# Patient Record
Sex: Female | Born: 1960
Health system: Southern US, Community
[De-identification: ages and names within clinical notes are randomized; demographics above are authoritative.]

## PROBLEM LIST (undated history)

## (undated) DIAGNOSIS — R569 Unspecified convulsions: Secondary | ICD-10-CM

## (undated) DIAGNOSIS — G5602 Carpal tunnel syndrome, left upper limb: Secondary | ICD-10-CM

## (undated) DIAGNOSIS — R51 Headache: Secondary | ICD-10-CM

## (undated) DIAGNOSIS — F32A Depression, unspecified: Secondary | ICD-10-CM

## (undated) DIAGNOSIS — T148XXA Other injury of unspecified body region, initial encounter: Secondary | ICD-10-CM

## (undated) DIAGNOSIS — M722 Plantar fascial fibromatosis: Secondary | ICD-10-CM

## (undated) DIAGNOSIS — Z9889 Other specified postprocedural states: Secondary | ICD-10-CM

## (undated) DIAGNOSIS — K219 Gastro-esophageal reflux disease without esophagitis: Secondary | ICD-10-CM

## (undated) DIAGNOSIS — F419 Anxiety disorder, unspecified: Secondary | ICD-10-CM

## (undated) DIAGNOSIS — F99 Mental disorder, not otherwise specified: Secondary | ICD-10-CM

## (undated) DIAGNOSIS — G4730 Sleep apnea, unspecified: Secondary | ICD-10-CM

## (undated) HISTORY — PX: BUNIONECTOMY: SHX129

## (undated) HISTORY — PX: HAMMER TOE SURGERY: SHX385

## (undated) HISTORY — PX: ELBOW SURGERY: SHX618

## (undated) HISTORY — PX: PLANTAR FASCIA RELEASE: SHX2239

---

## 1998-05-03 ENCOUNTER — Emergency Department (HOSPITAL_COMMUNITY): Admission: EM | Admit: 1998-05-03 | Discharge: 1998-05-03 | Payer: Self-pay | Admitting: Emergency Medicine

## 1998-10-09 ENCOUNTER — Emergency Department (HOSPITAL_COMMUNITY): Admission: EM | Admit: 1998-10-09 | Discharge: 1998-10-09 | Payer: Self-pay | Admitting: Emergency Medicine

## 1998-10-09 ENCOUNTER — Encounter: Payer: Self-pay | Admitting: Emergency Medicine

## 1998-12-27 ENCOUNTER — Emergency Department (HOSPITAL_COMMUNITY): Admission: EM | Admit: 1998-12-27 | Discharge: 1998-12-27 | Payer: Self-pay | Admitting: Emergency Medicine

## 1999-07-11 ENCOUNTER — Emergency Department (HOSPITAL_COMMUNITY): Admission: EM | Admit: 1999-07-11 | Discharge: 1999-07-11 | Payer: Self-pay

## 1999-12-27 ENCOUNTER — Emergency Department (HOSPITAL_COMMUNITY): Admission: EM | Admit: 1999-12-27 | Discharge: 1999-12-27 | Payer: Self-pay | Admitting: *Deleted

## 2004-01-29 ENCOUNTER — Emergency Department (HOSPITAL_COMMUNITY): Admission: EM | Admit: 2004-01-29 | Discharge: 2004-01-29 | Payer: Self-pay | Admitting: Emergency Medicine

## 2004-04-15 ENCOUNTER — Emergency Department (HOSPITAL_COMMUNITY): Admission: EM | Admit: 2004-04-15 | Discharge: 2004-04-15 | Payer: Self-pay | Admitting: Emergency Medicine

## 2005-01-08 ENCOUNTER — Emergency Department (HOSPITAL_COMMUNITY): Admission: EM | Admit: 2005-01-08 | Discharge: 2005-01-08 | Payer: Self-pay | Admitting: Family Medicine

## 2005-05-15 ENCOUNTER — Emergency Department (HOSPITAL_COMMUNITY): Admission: EM | Admit: 2005-05-15 | Discharge: 2005-05-15 | Payer: Self-pay | Admitting: Emergency Medicine

## 2005-08-02 ENCOUNTER — Emergency Department (HOSPITAL_COMMUNITY): Admission: EM | Admit: 2005-08-02 | Discharge: 2005-08-02 | Payer: Self-pay | Admitting: Emergency Medicine

## 2006-02-26 ENCOUNTER — Emergency Department (HOSPITAL_COMMUNITY): Admission: EM | Admit: 2006-02-26 | Discharge: 2006-02-26 | Payer: Self-pay | Admitting: Emergency Medicine

## 2006-07-02 ENCOUNTER — Inpatient Hospital Stay (HOSPITAL_COMMUNITY): Admission: AD | Admit: 2006-07-02 | Discharge: 2006-07-02 | Payer: Self-pay | Admitting: Obstetrics and Gynecology

## 2006-08-29 ENCOUNTER — Emergency Department (HOSPITAL_COMMUNITY): Admission: EM | Admit: 2006-08-29 | Discharge: 2006-08-29 | Payer: Self-pay | Admitting: Family Medicine

## 2006-09-19 ENCOUNTER — Emergency Department (HOSPITAL_COMMUNITY): Admission: EM | Admit: 2006-09-19 | Discharge: 2006-09-19 | Payer: Self-pay | Admitting: Emergency Medicine

## 2006-11-11 ENCOUNTER — Emergency Department (HOSPITAL_COMMUNITY): Admission: EM | Admit: 2006-11-11 | Discharge: 2006-11-11 | Payer: Self-pay | Admitting: Emergency Medicine

## 2006-11-22 ENCOUNTER — Emergency Department (HOSPITAL_COMMUNITY): Admission: EM | Admit: 2006-11-22 | Discharge: 2006-11-22 | Payer: Self-pay | Admitting: Family Medicine

## 2007-01-11 ENCOUNTER — Emergency Department (HOSPITAL_COMMUNITY): Admission: EM | Admit: 2007-01-11 | Discharge: 2007-01-11 | Payer: Self-pay | Admitting: Family Medicine

## 2007-03-11 ENCOUNTER — Ambulatory Visit (HOSPITAL_COMMUNITY): Admission: RE | Admit: 2007-03-11 | Discharge: 2007-03-11 | Payer: Self-pay | Admitting: Family Medicine

## 2007-05-11 ENCOUNTER — Emergency Department (HOSPITAL_COMMUNITY): Admission: EM | Admit: 2007-05-11 | Discharge: 2007-05-12 | Payer: Self-pay | Admitting: Emergency Medicine

## 2007-09-22 ENCOUNTER — Emergency Department (HOSPITAL_COMMUNITY): Admission: EM | Admit: 2007-09-22 | Discharge: 2007-09-22 | Payer: Self-pay | Admitting: Family Medicine

## 2009-02-15 ENCOUNTER — Emergency Department (HOSPITAL_COMMUNITY): Admission: EM | Admit: 2009-02-15 | Discharge: 2009-02-15 | Payer: Self-pay | Admitting: Family Medicine

## 2009-06-07 ENCOUNTER — Emergency Department (HOSPITAL_COMMUNITY): Admission: EM | Admit: 2009-06-07 | Discharge: 2009-06-07 | Payer: Self-pay | Admitting: Family Medicine

## 2010-04-05 ENCOUNTER — Emergency Department (HOSPITAL_COMMUNITY): Admission: EM | Admit: 2010-04-05 | Discharge: 2010-04-05 | Payer: Self-pay | Admitting: Family Medicine

## 2010-04-05 ENCOUNTER — Emergency Department (HOSPITAL_COMMUNITY): Admission: EM | Admit: 2010-04-05 | Discharge: 2010-04-05 | Payer: Self-pay | Admitting: Emergency Medicine

## 2011-01-31 LAB — POCT I-STAT, CHEM 8
BUN: 18 mg/dL (ref 6–23)
Calcium, Ion: 1.18 mmol/L (ref 1.12–1.32)
Chloride: 105 mEq/L (ref 96–112)
Creatinine, Ser: 1 mg/dL (ref 0.4–1.2)
Potassium: 4.1 mEq/L (ref 3.5–5.1)
Sodium: 135 mEq/L (ref 135–145)

## 2011-07-19 ENCOUNTER — Emergency Department (HOSPITAL_COMMUNITY)
Admission: EM | Admit: 2011-07-19 | Discharge: 2011-07-19 | Disposition: A | Discharge status: Home / Self Care | Payer: Medicare Other | Attending: Emergency Medicine | Admitting: Emergency Medicine

## 2011-07-19 ENCOUNTER — Emergency Department (HOSPITAL_COMMUNITY): Payer: Medicare Other

## 2011-07-19 DIAGNOSIS — M542 Cervicalgia: Secondary | ICD-10-CM | POA: Insufficient documentation

## 2011-07-19 DIAGNOSIS — Z79899 Other long term (current) drug therapy: Secondary | ICD-10-CM | POA: Insufficient documentation

## 2011-07-19 DIAGNOSIS — R209 Unspecified disturbances of skin sensation: Secondary | ICD-10-CM | POA: Insufficient documentation

## 2011-07-19 DIAGNOSIS — S139XXA Sprain of joints and ligaments of unspecified parts of neck, initial encounter: Secondary | ICD-10-CM | POA: Insufficient documentation

## 2011-07-19 DIAGNOSIS — G40909 Epilepsy, unspecified, not intractable, without status epilepticus: Secondary | ICD-10-CM | POA: Insufficient documentation

## 2011-07-19 DIAGNOSIS — IMO0002 Reserved for concepts with insufficient information to code with codable children: Secondary | ICD-10-CM | POA: Insufficient documentation

## 2011-07-19 DIAGNOSIS — M25569 Pain in unspecified knee: Secondary | ICD-10-CM | POA: Insufficient documentation

## 2011-08-06 LAB — POCT CARDIAC MARKERS
CKMB, poc: <1 — ABNORMAL LOW
Troponin i, poc: <0.05

## 2011-08-06 LAB — COMPREHENSIVE METABOLIC PANEL
Alkaline Phosphatase: 47
BUN: 9
GFR calc non Af Amer: >60
Sodium: 138
Total Protein: 6.3

## 2011-08-06 LAB — CBC
HCT: 32.9 — ABNORMAL LOW
MCHC: 34.1
RDW: 13
WBC: 6.1

## 2011-08-06 LAB — DIFFERENTIAL
Eosinophils Absolute: 0.1
Lymphocytes Relative: 39
Lymphs Abs: 2.4
Monocytes Relative: 12 — ABNORMAL HIGH
Neutrophils Relative %: 48

## 2011-10-19 ENCOUNTER — Emergency Department (HOSPITAL_COMMUNITY): Payer: Medicare Other

## 2011-10-19 ENCOUNTER — Emergency Department (HOSPITAL_COMMUNITY)
Admission: EM | Admit: 2011-10-19 | Discharge: 2011-10-19 | Disposition: A | Discharge status: Home / Self Care | Payer: Medicare Other | Attending: Emergency Medicine | Admitting: Emergency Medicine

## 2011-10-19 DIAGNOSIS — R0602 Shortness of breath: Secondary | ICD-10-CM | POA: Insufficient documentation

## 2011-10-19 DIAGNOSIS — R059 Cough, unspecified: Secondary | ICD-10-CM | POA: Insufficient documentation

## 2011-10-19 DIAGNOSIS — R6883 Chills (without fever): Secondary | ICD-10-CM | POA: Insufficient documentation

## 2011-10-19 DIAGNOSIS — IMO0001 Reserved for inherently not codable concepts without codable children: Secondary | ICD-10-CM | POA: Insufficient documentation

## 2011-10-19 DIAGNOSIS — R05 Cough: Secondary | ICD-10-CM | POA: Insufficient documentation

## 2011-10-19 DIAGNOSIS — J40 Bronchitis, not specified as acute or chronic: Secondary | ICD-10-CM

## 2011-10-19 DIAGNOSIS — J45909 Unspecified asthma, uncomplicated: Secondary | ICD-10-CM | POA: Insufficient documentation

## 2011-10-19 DIAGNOSIS — R079 Chest pain, unspecified: Secondary | ICD-10-CM | POA: Insufficient documentation

## 2011-10-19 HISTORY — DX: Plantar fascial fibromatosis: M72.2

## 2011-10-19 HISTORY — DX: Other injury of unspecified body region, initial encounter: T14.8XXA

## 2011-10-19 MED ORDER — HYDROCOD POLST-CHLORPHEN POLST 10-8 MG/5ML PO LQCR: 5.0000 mL | Freq: Two times a day (BID) | ORAL | Status: DC | PRN

## 2011-10-19 MED ORDER — ALBUTEROL SULFATE (5 MG/ML) 0.5% IN NEBU
5.0000 mg | INHALATION_SOLUTION | Freq: Once | RESPIRATORY_TRACT | Status: AC
  Administered 2011-10-19: 5 mg via RESPIRATORY_TRACT
  Filled 2011-10-19: qty 1

## 2011-10-19 MED ORDER — PREDNISONE 20 MG PO TABS
60.0000 mg | ORAL_TABLET | Freq: Once | ORAL | Status: AC
  Administered 2011-10-19: 60 mg via ORAL
  Filled 2011-10-19: qty 3

## 2011-10-19 MED ORDER — AZITHROMYCIN 250 MG PO TABS: ORAL_TABLET | ORAL | Status: AC

## 2011-10-19 MED ORDER — IPRATROPIUM BROMIDE 0.02 % IN SOLN
0.5000 mg | Freq: Once | RESPIRATORY_TRACT | Status: AC
  Administered 2011-10-19: 0.5 mg via RESPIRATORY_TRACT
  Filled 2011-10-19: qty 2.5

## 2011-10-19 MED ORDER — HYDROCOD POLST-CHLORPHEN POLST 10-8 MG/5ML PO LQCR
ORAL | Status: AC
  Administered 2011-10-19: 5 mL
  Filled 2011-10-19: qty 5

## 2011-10-19 MED ORDER — ZOLPIDEM TARTRATE 10 MG PO TABS: 10.0000 mg | ORAL_TABLET | Freq: Every evening | ORAL | Status: DC | PRN

## 2011-10-19 NOTE — ED Provider Notes (Signed)
History     CSN: [id number]  Arrival date & time 10/19/11  0820   First MD Initiated Contact with Patient 10/19/11 0831      Chief Complaint  Patient presents with  . Asthma    (Consider location/radiation/quality/duration/timing/severity/associated sxs/prior treatment) Patient is a 50 y.o. female presenting with asthma. The history is provided by the patient.  Asthma This is a new problem. The current episode started in the past 7 days. The problem occurs constantly. The problem has been gradually worsening. Associated symptoms include chest pain, chills, congestion, coughing and myalgias. Pertinent negatives include no fever, rash, sore throat or vomiting. Associated symptoms comments: Symptoms of URI including cough, wheezing, chest tightness similar to other family members in household.. Treatments tried: Using Albuterol inhaler without cough relief. She continues to smoke.    History reviewed. No pertinent past medical history.  No past surgical history on file.  No family history on file.  History  Substance Use Topics  . Smoking status: Not on file  . Smokeless tobacco: Not on file  . Alcohol Use: Not on file    OB History    Grav Para Term Preterm Abortions TAB SAB Ect Mult Living                  Review of Systems  Constitutional: Positive for chills. Negative for fever.  HENT: Positive for congestion. Negative for sore throat and rhinorrhea.   Respiratory: Positive for cough, shortness of breath and wheezing.   Cardiovascular: Positive for chest pain.  Gastrointestinal: Negative.  Negative for vomiting.  Musculoskeletal: Positive for myalgias.  Skin: Negative.  Negative for rash.  Neurological: Negative.     Allergies  Review of patient's allergies indicates no known allergies.  Home Medications   Current Outpatient Rx  Name Route Sig Dispense Refill  . ALBUTEROL SULFATE HFA 108 (90 BASE) MCG/ACT IN AERS Inhalation Inhale 2 puffs into the lungs  every 6 (six) hours as needed. For shortness of breath or wheeze     . DULOXETINE HCL 30 MG PO CPEP Oral Take 30 mg by mouth daily as needed.      Marland Kitchen GABAPENTIN 100 MG PO CAPS Oral Take 100 mg by mouth 3 (three) times daily.      Marland Kitchen HYDROCODONE-ACETAMINOPHEN 5-500 MG PO TABS Oral Take 1 tablet by mouth every 6 (six) hours as needed. For pain       BP 113/81  Pulse 88  Temp(Src) 98 F (36.7 C) (Oral)  Resp 20  SpO2 96%  Physical Exam  Constitutional: She appears well-developed and well-nourished.  HENT:  Head: Normocephalic.  Nose: Mucosal edema present.  Mouth/Throat: Mucous membranes are normal. Posterior oropharyngeal erythema present. No posterior oropharyngeal edema.  Neck: Normal range of motion. Neck supple.  Cardiovascular: Normal rate and regular rhythm.   Pulmonary/Chest: Effort normal.       Prolonged expiration with wheezing throughout cycle. No rales, rhonchi.  Abdominal: Soft. Bowel sounds are normal. There is no tenderness. There is no rebound and no guarding.  Musculoskeletal: Normal range of motion. She exhibits no edema.  Neurological: She is alert. No cranial nerve deficit.  Skin: Skin is warm and dry. No rash noted.  Psychiatric: She has a normal mood and affect.    ED Course  Procedures (including critical care time)  Labs Reviewed - No data to display No results found.   No diagnosis found.    MDM  Patient appears much more comfortable after 2 nebulizers and  Tussionex. Lung sounds improved with less wheezing, but still with diffuse rhonchi.        Rodena Medin, PA 10/19/11 1052

## 2011-10-19 NOTE — ED Notes (Signed)
Pt c/o sob, chest pain d/t coughing, productive cough w/yellow colored sputum x4 days

## 2011-10-19 NOTE — ED Notes (Signed)
Pt c/o sob, low back pain, non cardiac chest pain d/t cough and congestion, and productive cough w/yellow colored sputum x4 days. Reports hx of asthma, denies using at home nebulizer tx, sts "I just didn't feel like it, I felt so bad."

## 2011-10-20 NOTE — ED Provider Notes (Signed)
Medical screening examination/treatment/procedure(s) were performed by non-physician practitioner and as supervising physician I was immediately available for consultation/collaboration.  Glorious Flicker, MD 10/20/11 0933 

## 2012-04-16 ENCOUNTER — Encounter (HOSPITAL_COMMUNITY): Payer: Self-pay | Admitting: Emergency Medicine

## 2012-04-16 ENCOUNTER — Emergency Department (HOSPITAL_COMMUNITY)
Admission: EM | Admit: 2012-04-16 | Discharge: 2012-04-16 | Disposition: A | Discharge status: Home / Self Care | Payer: Medicare Other | Attending: Emergency Medicine | Admitting: Emergency Medicine

## 2012-04-16 DIAGNOSIS — J45909 Unspecified asthma, uncomplicated: Secondary | ICD-10-CM

## 2012-04-16 DIAGNOSIS — F17200 Nicotine dependence, unspecified, uncomplicated: Secondary | ICD-10-CM | POA: Insufficient documentation

## 2012-04-16 DIAGNOSIS — Z720 Tobacco use: Secondary | ICD-10-CM

## 2012-04-16 MED ORDER — ALBUTEROL SULFATE (5 MG/ML) 0.5% IN NEBU
5.0000 mg | INHALATION_SOLUTION | Freq: Once | RESPIRATORY_TRACT | Status: AC
  Administered 2012-04-16: 5 mg via RESPIRATORY_TRACT
  Filled 2012-04-16: qty 0.5

## 2012-04-16 MED ORDER — PREDNISONE 20 MG PO TABS
60.0000 mg | ORAL_TABLET | Freq: Once | ORAL | Status: AC
  Administered 2012-04-16: 60 mg via ORAL
  Filled 2012-04-16: qty 3

## 2012-04-16 MED ORDER — ALBUTEROL SULFATE (5 MG/ML) 0.5% IN NEBU
2.5000 mg | INHALATION_SOLUTION | Freq: Once | RESPIRATORY_TRACT | Status: AC
  Administered 2012-04-16: 2.5 mg via RESPIRATORY_TRACT
  Filled 2012-04-16: qty 0.5

## 2012-04-16 MED ORDER — IPRATROPIUM BROMIDE 0.02 % IN SOLN
RESPIRATORY_TRACT | Status: AC
  Filled 2012-04-16: qty 2.5

## 2012-04-16 MED ORDER — PREDNISONE 20 MG PO TABS: 60.0000 mg | ORAL_TABLET | Freq: Every day | ORAL | Status: AC

## 2012-04-16 MED ORDER — IPRATROPIUM BROMIDE 0.02 % IN SOLN
0.5000 mg | Freq: Once | RESPIRATORY_TRACT | Status: AC
  Administered 2012-04-16: 0.5 mg via RESPIRATORY_TRACT
  Filled 2012-04-16: qty 2.5

## 2012-04-16 MED ORDER — ZOLPIDEM TARTRATE 5 MG PO TABS: 5.0000 mg | ORAL_TABLET | Freq: Every evening | ORAL | Status: DC | PRN

## 2012-04-16 NOTE — Discharge Instructions (Signed)
STOP SMOKING. USE YOUR REGULAR MEDICATIONS AS PRESCRIBED AND TAKE PREDNISONE AS DIRECTED FOR THE NEXT 3 DAYS. RETURN HERE WITH ANY WORSENING SYMPTOMS OR NEW CONCERNS.

## 2012-04-16 NOTE — ED Provider Notes (Signed)
History     CSN: [id number]  Arrival date & time 04/16/12  0541   First MD Initiated Contact with Patient 04/16/12 [phone]      Chief Complaint  Patient presents with  . Shortness of Breath    (Consider location/radiation/quality/duration/timing/severity/associated sxs/prior treatment) Patient is a 51 y.o. female presenting with shortness of breath. The history is provided by the patient.  Shortness of Breath  The current episode started 3 to 5 days ago. The problem has been gradually worsening. The problem is moderate. Associated symptoms include cough, shortness of breath and wheezing. Pertinent negatives include no fever. Associated symptoms comments: Increased wheezing with nasal congestion, chest tightness with cough, decreasing effectiveness of her usual medications. She continues to smoke.. Her past medical history is significant for asthma.    Past Medical History  Diagnosis Date  . Asthma   . Nerve damage   . Plantar fasciitis     Past Surgical History  Procedure Date  . Elbow debridement     Left elbow  . Foot bone excision     bilateral    No family history on file.  History  Substance Use Topics  . Smoking status: Current Everyday Smoker  . Smokeless tobacco: Not on file  . Alcohol Use: Yes    OB History    Grav Para Term Preterm Abortions TAB SAB Ect Mult Living                  Review of Systems  Constitutional: Negative for fever.  HENT: Positive for congestion.   Respiratory: Positive for cough, shortness of breath and wheezing.   Gastrointestinal: Negative for vomiting.    Allergies  Review of patient's allergies indicates no known allergies.  Home Medications   Current Outpatient Rx  Name Route Sig Dispense Refill  . ALBUTEROL SULFATE HFA 108 (90 BASE) MCG/ACT IN AERS Inhalation Inhale 2 puffs into the lungs every 6 (six) hours as needed. For shortness of breath or wheeze     . BUDESONIDE-FORMOTEROL FUMARATE 160-4.5 MCG/ACT IN AERO  Inhalation Inhale 2 puffs into the lungs 2 (two) times daily.    . DULOXETINE HCL 30 MG PO CPEP Oral Take 30 mg by mouth daily as needed.      Marland Kitchen GABAPENTIN 100 MG PO CAPS Oral Take 100 mg by mouth 3 (three) times daily.      Marland Kitchen HYDROCODONE-ACETAMINOPHEN 5-500 MG PO TABS Oral Take 1 tablet by mouth every 6 (six) hours as needed. For pain     . LYRICA PO Oral Take 1 capsule by mouth daily.    . SEROQUEL PO Oral Take 1 tablet by mouth daily.    Marland Kitchen ZOLPIDEM TARTRATE 5 MG PO TABS Oral Take 5 mg by mouth at bedtime as needed. For sleep    . ZOLPIDEM TARTRATE 10 MG PO TABS Oral Take 1 tablet (10 mg total) by mouth at bedtime as needed for sleep. 5 tablet 0    BP 104/73  Pulse 83  Temp 97.9 F (36.6 C) (Oral)  Resp 18  SpO2 96%  Physical Exam  Constitutional: She is oriented to person, place, and time. She appears well-developed and well-nourished. No distress.  HENT:  Mouth/Throat: Oropharynx is clear and moist.  Neck: Normal range of motion.  Cardiovascular: Normal rate and regular rhythm.   No murmur heard. Pulmonary/Chest: Effort normal. No respiratory distress. She has wheezes. She has no rales. She exhibits no tenderness.  Abdominal: There is no tenderness.  Musculoskeletal: She  exhibits no edema.  Neurological: She is alert and oriented to person, place, and time.    ED Course  Procedures (including critical care time)  Labs Reviewed - No data to display No results found.   No diagnosis found. 1. Asthma 2. Tobacco Abuse   MDM  She feels significantly improved after 2 nebulizer treatments and requesting discharge. Wheezing is resolved. Will discharge home.        Rodena Medin, PA-C 04/16/12 [phone]

## 2012-04-16 NOTE — ED Notes (Signed)
Patient with increased shortness of breath increasing in last few days with cough and audible wheezing.

## 2012-04-17 NOTE — ED Provider Notes (Signed)
Medical screening examination/treatment/procedure(s) were performed by non-physician practitioner and as supervising physician I was immediately available for consultation/collaboration.   Darnelle Derrick L Denelle Capurro, MD 04/17/12 [phone] 

## 2012-10-02 ENCOUNTER — Emergency Department (HOSPITAL_COMMUNITY)
Admission: EM | Admit: 2012-10-02 | Discharge: 2012-10-02 | Disposition: A | Discharge status: Home / Self Care | Payer: Medicare Other | Attending: Emergency Medicine | Admitting: Emergency Medicine

## 2012-10-02 ENCOUNTER — Emergency Department (HOSPITAL_COMMUNITY): Payer: Medicare Other

## 2012-10-02 ENCOUNTER — Encounter (HOSPITAL_COMMUNITY): Payer: Self-pay | Admitting: *Deleted

## 2012-10-02 DIAGNOSIS — L299 Pruritus, unspecified: Secondary | ICD-10-CM | POA: Insufficient documentation

## 2012-10-02 DIAGNOSIS — F17200 Nicotine dependence, unspecified, uncomplicated: Secondary | ICD-10-CM | POA: Insufficient documentation

## 2012-10-02 DIAGNOSIS — J3489 Other specified disorders of nose and nasal sinuses: Secondary | ICD-10-CM | POA: Insufficient documentation

## 2012-10-02 DIAGNOSIS — Z8669 Personal history of other diseases of the nervous system and sense organs: Secondary | ICD-10-CM | POA: Insufficient documentation

## 2012-10-02 DIAGNOSIS — J45909 Unspecified asthma, uncomplicated: Secondary | ICD-10-CM | POA: Insufficient documentation

## 2012-10-02 DIAGNOSIS — R21 Rash and other nonspecific skin eruption: Secondary | ICD-10-CM | POA: Insufficient documentation

## 2012-10-02 DIAGNOSIS — J069 Acute upper respiratory infection, unspecified: Secondary | ICD-10-CM | POA: Insufficient documentation

## 2012-10-02 DIAGNOSIS — Z8739 Personal history of other diseases of the musculoskeletal system and connective tissue: Secondary | ICD-10-CM | POA: Insufficient documentation

## 2012-10-02 DIAGNOSIS — Z79899 Other long term (current) drug therapy: Secondary | ICD-10-CM | POA: Insufficient documentation

## 2012-10-02 MED ORDER — HYDROXYZINE HCL 25 MG PO TABS: 25.0000 mg | ORAL_TABLET | Freq: Four times a day (QID) | ORAL | Status: DC

## 2012-10-02 MED ORDER — AMITRIPTYLINE HCL 25 MG PO TABS: 25.0000 mg | ORAL_TABLET | Freq: Every day | ORAL | Status: DC

## 2012-10-02 MED ORDER — METHYLPREDNISOLONE 4 MG PO KIT: PACK | ORAL | Status: DC

## 2012-10-02 MED ORDER — DULOXETINE HCL 30 MG PO CPEP: 30.0000 mg | ORAL_CAPSULE | Freq: Every day | ORAL | Status: DC

## 2012-10-02 MED ORDER — BENZONATATE 100 MG PO CAPS
100.0000 mg | ORAL_CAPSULE | Freq: Three times a day (TID) | ORAL | Status: DC
Start: 2012-10-02 — End: 2012-11-19

## 2012-10-02 NOTE — ED Notes (Signed)
Patient states she uses a CPAP at night  Also noticed "bumps" on her right forearm

## 2012-10-02 NOTE — ED Provider Notes (Signed)
History     CSN: [id number]  Arrival date & time 10/02/12  4098   First MD Initiated Contact with Patient 10/02/12 [phone]      Chief Complaint  Patient presents with  . Cough    (Consider location/radiation/quality/duration/timing/severity/associated sxs/prior treatment) HPI  Alyssa Schwartz is a 51 y.o. female complaining of rhinorrhea for one day. Patient reports a dry cough denies fever, chest pain, shortness of breath, abdominal pain, nausea vomiting, change in bowel or bladder habits. Patient also reports a pruritic rash to right arm onset yesterday. Patient is also requesting refills of amitriptyline, Cymbalta and pain medication. She follows with the [place].  Past Medical History  Diagnosis Date  . Asthma   . Nerve damage   . Plantar fasciitis     Past Surgical History  Procedure Date  . Elbow debridement     Left elbow  . Foot bone excision     bilateral    History reviewed. No pertinent family history.  History  Substance Use Topics  . Smoking status: Current Every Day Smoker  . Smokeless tobacco: Not on file  . Alcohol Use: Yes    OB History    Grav Para Term Preterm Abortions TAB SAB Ect Mult Living                  Review of Systems  Constitutional: Negative for fever.  HENT: Positive for rhinorrhea.   Respiratory: Negative for shortness of breath.   Cardiovascular: Negative for chest pain.  Gastrointestinal: Negative for nausea, vomiting, abdominal pain and diarrhea.  Skin: Positive for rash.  All other systems reviewed and are negative.    Allergies  Review of patient's allergies indicates no known allergies.  Home Medications   Current Outpatient Rx  Name  Route  Sig  Dispense  Refill  . ALBUTEROL SULFATE HFA 108 (90 BASE) MCG/ACT IN AERS   Inhalation   Inhale 2 puffs into the lungs every 6 (six) hours as needed. For shortness of breath or wheeze          . AMITRIPTYLINE HCL PO   Oral   Take 1 tablet by mouth daily.         .  BUDESONIDE-FORMOTEROL FUMARATE 160-4.5 MCG/ACT IN AERO   Inhalation   Inhale 2 puffs into the lungs 2 (two) times daily.         . DULOXETINE HCL 30 MG PO CPEP   Oral   Take 30 mg by mouth daily as needed.           Marland Kitchen GABAPENTIN 100 MG PO CAPS   Oral   Take 100 mg by mouth 3 (three) times daily.           Marland Kitchen HYDROCODONE-ACETAMINOPHEN 5-500 MG PO TABS   Oral   Take 1 tablet by mouth every 6 (six) hours as needed. For pain          . LYRICA PO   Oral   Take 1 capsule by mouth daily.         . QUETIAPINE FUMARATE ER 150 MG PO TB24   Oral   Take 150 mg by mouth at bedtime.         Marland Kitchen ZOLPIDEM TARTRATE 5 MG PO TABS   Oral   Take 1 tablet (5 mg total) by mouth at bedtime as needed. For sleep   10 tablet   0   . ZOLPIDEM TARTRATE 10 MG PO TABS   Oral  Take 1 tablet (10 mg total) by mouth at bedtime as needed for sleep.   5 tablet   0     BP 103/61  Pulse 73  Temp 98.4 F (36.9 C) (Oral)  Resp 24  Ht 5\' 11"  (1.803 m)  Wt 230 lb (104.327 kg)  BMI 32.08 kg/m2  SpO2 99%  Physical Exam  Nursing note and vitals reviewed. Constitutional: She is oriented to person, place, and time. She appears well-developed and well-nourished. No distress.  HENT:  Head: Normocephalic.  Mouth/Throat: Oropharynx is clear and moist.  Eyes: Conjunctivae normal and EOM are normal. Pupils are equal, round, and reactive to light.  Neck: Normal range of motion.  Cardiovascular: Normal rate, regular rhythm and intact distal pulses.   Pulmonary/Chest: Effort normal and breath sounds normal. No stridor. No respiratory distress. She has no wheezes. She has no rales. She exhibits no tenderness.  Abdominal: Soft. Bowel sounds are normal.  Musculoskeletal: Normal range of motion.  Neurological: She is alert and oriented to person, place, and time.  Skin:       Excoriated macules to right arm. Mildly erythematous, no warmth, discharge or tenderness.  Psychiatric: She has a normal mood and  affect.    ED Course  Procedures (including critical care time)  Labs Reviewed - No data to display Dg Chest 2 View  10/02/2012  *RADIOLOGY REPORT*  Clinical Data: Cough and congestion.  CHEST - 2 VIEW  Comparison: 10/19/2011.  Findings: Central pulmonary vascular prominence.  No segmental consolidation or pneumothorax.  Apical pleural thickening greater on the left without associated bony destruction.  Mildly tortuous aorta.  Heart size top normal.  IMPRESSION: No segmental consolidation.  Please see above.   Original Report Authenticated By: Lacy Duverney, M.D.      1. URI (upper respiratory infection)   2. Rash and nonspecific skin eruption       MDM  Vital signs stable, chest x-ray clear, symptoms consistent with URI. Discussed hydration and rest.  We'll treat rash is allergic reaction with Medrol Dosepak and hydroxyzine. Dermatology followup given.   I will refill her amitriptyline and Cymbalta, deferred refill of Vicodin.        Wynetta Emery, PA-C 10/02/12 [phone]

## 2012-10-02 NOTE — ED Notes (Signed)
Patient states her bronchitis is acting up, has a rash on her arms and has chest discomfort from coughing.  Productive cough at times.

## 2012-10-03 NOTE — ED Provider Notes (Signed)
Medical screening examination/treatment/procedure(s) were performed by non-physician practitioner and as supervising physician I was immediately available for consultation/collaboration.    Gerhard Munch, MD 10/03/12 [phone]

## 2012-11-19 ENCOUNTER — Emergency Department (HOSPITAL_COMMUNITY)
Admission: EM | Admit: 2012-11-19 | Discharge: 2012-11-19 | Disposition: A | Discharge status: Home / Self Care | Payer: Medicare Other | Attending: Emergency Medicine | Admitting: Emergency Medicine

## 2012-11-19 ENCOUNTER — Emergency Department (HOSPITAL_COMMUNITY): Payer: Medicare Other

## 2012-11-19 ENCOUNTER — Encounter (HOSPITAL_COMMUNITY): Payer: Self-pay | Admitting: Emergency Medicine

## 2012-11-19 DIAGNOSIS — R05 Cough: Secondary | ICD-10-CM | POA: Insufficient documentation

## 2012-11-19 DIAGNOSIS — J45909 Unspecified asthma, uncomplicated: Secondary | ICD-10-CM | POA: Insufficient documentation

## 2012-11-19 DIAGNOSIS — Z79899 Other long term (current) drug therapy: Secondary | ICD-10-CM | POA: Insufficient documentation

## 2012-11-19 DIAGNOSIS — J189 Pneumonia, unspecified organism: Secondary | ICD-10-CM

## 2012-11-19 DIAGNOSIS — F17200 Nicotine dependence, unspecified, uncomplicated: Secondary | ICD-10-CM | POA: Insufficient documentation

## 2012-11-19 DIAGNOSIS — R059 Cough, unspecified: Secondary | ICD-10-CM | POA: Insufficient documentation

## 2012-11-19 DIAGNOSIS — Z8669 Personal history of other diseases of the nervous system and sense organs: Secondary | ICD-10-CM | POA: Insufficient documentation

## 2012-11-19 DIAGNOSIS — Z8739 Personal history of other diseases of the musculoskeletal system and connective tissue: Secondary | ICD-10-CM | POA: Insufficient documentation

## 2012-11-19 LAB — BASIC METABOLIC PANEL
BUN: 15 mg/dL (ref 6–23)
Chloride: 102 mEq/L (ref 96–112)
Creatinine, Ser: 0.77 mg/dL (ref 0.50–1.10)
GFR calc Af Amer: >90 mL/min (ref >90)

## 2012-11-19 LAB — CBC
HCT: 38.2 % (ref 36.0–46.0)
MCHC: 32.5 g/dL (ref 30.0–36.0)
RDW: 15.6 % — ABNORMAL HIGH (ref 11.5–15.5)

## 2012-11-19 MED ORDER — PREDNISONE 20 MG PO TABS
60.0000 mg | ORAL_TABLET | ORAL | Status: AC
  Administered 2012-11-19: 60 mg via ORAL
  Filled 2012-11-19: qty 3

## 2012-11-19 MED ORDER — PREDNISONE 20 MG PO TABS: 60.0000 mg | ORAL_TABLET | Freq: Every day | ORAL | Status: AC

## 2012-11-19 MED ORDER — ONDANSETRON 4 MG PO TBDP
4.0000 mg | ORAL_TABLET | Freq: Once | ORAL | Status: AC
  Administered 2012-11-19: 4 mg via ORAL

## 2012-11-19 MED ORDER — AZITHROMYCIN 250 MG PO TABS: ORAL_TABLET | ORAL | Status: AC

## 2012-11-19 MED ORDER — AMITRIPTYLINE HCL 25 MG PO TABS: 25.0000 mg | ORAL_TABLET | Freq: Every day | ORAL | Status: DC

## 2012-11-19 MED ORDER — AZITHROMYCIN 250 MG PO TABS
500.0000 mg | ORAL_TABLET | Freq: Once | ORAL | Status: AC
  Administered 2012-11-19: 500 mg via ORAL
  Filled 2012-11-19: qty 2

## 2012-11-19 MED ORDER — DEXTROSE 5 % IV SOLN: 500.0000 mg | Freq: Once | INTRAVENOUS | Status: DC

## 2012-11-19 MED ORDER — ONDANSETRON 4 MG PO TBDP
ORAL_TABLET | ORAL | Status: AC
  Filled 2012-11-19: qty 1

## 2012-11-19 MED ORDER — ALBUTEROL SULFATE (5 MG/ML) 0.5% IN NEBU
2.5000 mg | INHALATION_SOLUTION | RESPIRATORY_TRACT | Status: AC
  Administered 2012-11-19: 2.5 mg via RESPIRATORY_TRACT
  Filled 2012-11-19: qty 20

## 2012-11-19 MED ORDER — IPRATROPIUM BROMIDE 0.02 % IN SOLN
0.5000 mg | RESPIRATORY_TRACT | Status: AC
Start: 2012-11-19 — End: 2012-11-19
  Administered 2012-11-19: 0.5 mg via RESPIRATORY_TRACT
  Filled 2012-11-19: qty 2.5

## 2012-11-19 NOTE — ED Notes (Signed)
EDP in room  

## 2012-11-19 NOTE — ED Notes (Signed)
Pt to XR

## 2012-11-19 NOTE — ED Notes (Signed)
NAD noted at time of d/c home 

## 2012-11-19 NOTE — ED Notes (Signed)
Pt c/o nasal/chest congestion for over 3 weeks. Was seen in ED "a few weeks ago" per pt. PMH asthma, COPD, sleep apnea.

## 2012-11-19 NOTE — ED Provider Notes (Signed)
History     CSN: [id number]  Arrival date & time 11/19/12  4098   First MD Initiated Contact with Patient 11/19/12 [phone]      Chief Complaint  Patient presents with  . Nasal Congestion    (Consider location/radiation/quality/duration/timing/severity/associated sxs/prior treatment) HPI  The patient presents with concern of ongoing cough, congestion, nasal stuffiness.  She states that since an ER visit approximately one month ago she is had persistent symptoms.  She notes minimal relief with short course of steroids, and ongoing albuterol inhaler use.  No new fever, no new confusion, no new disorientation, no new chest pain, no new abdominal pain, no nausea or vomiting. The patient recently had a rash, this has resolved. She states that since that last ER visit she has not seen her primary care physician.  Past Medical History  Diagnosis Date  . Asthma   . Nerve damage   . Plantar fasciitis     Past Surgical History  Procedure Date  . Elbow debridement     Left elbow  . Foot bone excision     bilateral  . Hand surgery     No family history on file.  History  Substance Use Topics  . Smoking status: Current Every Day Smoker  . Smokeless tobacco: Not on file  . Alcohol Use: Yes    OB History    Grav Para Term Preterm Abortions TAB SAB Ect Mult Living                  Review of Systems  Constitutional:       Per HPI, otherwise negative  HENT:       Per HPI, otherwise negative  Eyes: Negative.   Respiratory:       Per HPI, otherwise negative  Cardiovascular:       Per HPI, otherwise negative  Gastrointestinal: Negative for vomiting.  Genitourinary: Negative.   Musculoskeletal:       Per HPI, otherwise negative  Skin: Negative.   Neurological: Negative for syncope.    Allergies  Review of patient's allergies indicates no known allergies.  Home Medications   Current Outpatient Rx  Name  Route  Sig  Dispense  Refill  . ALBUTEROL SULFATE HFA 108 (90  BASE) MCG/ACT IN AERS   Inhalation   Inhale 2 puffs into the lungs every 6 (six) hours as needed. For shortness of breath or wheeze          . AMITRIPTYLINE HCL 25 MG PO TABS   Oral   Take 1 tablet (25 mg total) by mouth at bedtime.   15 tablet   0   . AMITRIPTYLINE HCL PO   Oral   Take 1 tablet by mouth daily.         Marland Kitchen BENZONATATE 100 MG PO CAPS   Oral   Take 1 capsule (100 mg total) by mouth every 8 (eight) hours.   21 capsule   0   . BUDESONIDE-FORMOTEROL FUMARATE 160-4.5 MCG/ACT IN AERO   Inhalation   Inhale 2 puffs into the lungs 2 (two) times daily.         . DULOXETINE HCL 30 MG PO CPEP   Oral   Take 30 mg by mouth daily as needed.           . DULOXETINE HCL 30 MG PO CPEP   Oral   Take 1 capsule (30 mg total) by mouth daily.   15 capsule   0   .  GABAPENTIN 100 MG PO CAPS   Oral   Take 100 mg by mouth 3 (three) times daily.           Marland Kitchen HYDROCODONE-ACETAMINOPHEN 5-500 MG PO TABS   Oral   Take 1 tablet by mouth every 6 (six) hours as needed. For pain          . HYDROXYZINE HCL 25 MG PO TABS   Oral   Take 1 tablet (25 mg total) by mouth every 6 (six) hours.   12 tablet   0   . METHYLPREDNISOLONE 4 MG PO KIT      As directed by package insert   21 tablet   0   . LYRICA PO   Oral   Take 1 capsule by mouth daily.         . QUETIAPINE FUMARATE ER 150 MG PO TB24   Oral   Take 150 mg by mouth at bedtime.         Marland Kitchen ZOLPIDEM TARTRATE 10 MG PO TABS   Oral   Take 1 tablet (10 mg total) by mouth at bedtime as needed for sleep.   5 tablet   0   . ZOLPIDEM TARTRATE 5 MG PO TABS   Oral   Take 1 tablet (5 mg total) by mouth at bedtime as needed. For sleep   10 tablet   0     BP 119/86  Pulse 76  Temp 98.4 F (36.9 C) (Oral)  Resp 20  Ht 5\' 11"  (1.803 m)  Wt 230 lb (104.327 kg)  BMI 32.08 kg/m2  SpO2 97%  Physical Exam  Nursing note and vitals reviewed. Constitutional: She is oriented to person, place, and time. She appears  well-developed and well-nourished. No distress.  HENT:  Head: Normocephalic and atraumatic.  Eyes: Conjunctivae normal and EOM are normal.  Cardiovascular: Normal rate and regular rhythm.   Pulmonary/Chest: Effort normal. No stridor. No respiratory distress. She has wheezes. She has rhonchi.  Abdominal: She exhibits no distension.  Musculoskeletal: She exhibits no edema.  Neurological: She is alert and oriented to person, place, and time. No cranial nerve deficit.  Skin: Skin is warm and dry.  Psychiatric: She has a normal mood and affect.    ED Course  Procedures (including critical care time)   Labs Reviewed  BASIC METABOLIC PANEL  CBC   No results found.   No diagnosis found.  O2- 99%ra, normal  8:48 AM Patient states that she is more comfortable.  9:45 AM The patient is sitting upright, in no distress.  Lung sounds are improved.  MDM  This patient with history of asthma presents with ongoing cough, congestion.  Notably, the patient was seen here approximately one month ago.  She has not seen her physician in the interim.  On exam the patient is in no distress, afebrile.  The patient's lung sounds are poor, but improve substantially with ED interventions.  The patient improved in general, and absent new acute findings, she was appropriate for discharge with primary care followup.  She was encouraged to do this.  She requested a refill of her medication on discharge.  She also requested refill of narcotics, which was deferred.  Gerhard Munch, MD 11/19/12 [phone]

## 2012-12-20 DIAGNOSIS — G5602 Carpal tunnel syndrome, left upper limb: Secondary | ICD-10-CM

## 2012-12-20 HISTORY — DX: Carpal tunnel syndrome, left upper limb: G56.02

## 2012-12-22 ENCOUNTER — Other Ambulatory Visit: Payer: Self-pay | Admitting: Orthopedic Surgery

## 2012-12-23 ENCOUNTER — Encounter (HOSPITAL_BASED_OUTPATIENT_CLINIC_OR_DEPARTMENT_OTHER): Payer: Self-pay | Admitting: *Deleted

## 2012-12-26 ENCOUNTER — Ambulatory Visit (HOSPITAL_BASED_OUTPATIENT_CLINIC_OR_DEPARTMENT_OTHER): Payer: Medicare Other | Admitting: Anesthesiology

## 2012-12-26 ENCOUNTER — Encounter (HOSPITAL_BASED_OUTPATIENT_CLINIC_OR_DEPARTMENT_OTHER): Payer: Self-pay | Admitting: Orthopedic Surgery

## 2012-12-26 ENCOUNTER — Encounter (HOSPITAL_BASED_OUTPATIENT_CLINIC_OR_DEPARTMENT_OTHER)
Admission: RE | Disposition: A | Discharge status: Home / Self Care | Payer: Self-pay | Source: Ambulatory Visit | Attending: Orthopedic Surgery

## 2012-12-26 ENCOUNTER — Ambulatory Visit (HOSPITAL_BASED_OUTPATIENT_CLINIC_OR_DEPARTMENT_OTHER)
Admission: RE | Admit: 2012-12-26 | Discharge: 2012-12-26 | Disposition: A | Discharge status: Home / Self Care | Payer: Medicare Other | Source: Ambulatory Visit | Attending: Orthopedic Surgery | Admitting: Orthopedic Surgery

## 2012-12-26 ENCOUNTER — Encounter (HOSPITAL_BASED_OUTPATIENT_CLINIC_OR_DEPARTMENT_OTHER): Payer: Self-pay | Admitting: Anesthesiology

## 2012-12-26 DIAGNOSIS — J45909 Unspecified asthma, uncomplicated: Secondary | ICD-10-CM | POA: Insufficient documentation

## 2012-12-26 DIAGNOSIS — G4730 Sleep apnea, unspecified: Secondary | ICD-10-CM | POA: Insufficient documentation

## 2012-12-26 DIAGNOSIS — G40909 Epilepsy, unspecified, not intractable, without status epilepticus: Secondary | ICD-10-CM | POA: Insufficient documentation

## 2012-12-26 DIAGNOSIS — G5600 Carpal tunnel syndrome, unspecified upper limb: Secondary | ICD-10-CM | POA: Insufficient documentation

## 2012-12-26 DIAGNOSIS — F17200 Nicotine dependence, unspecified, uncomplicated: Secondary | ICD-10-CM | POA: Insufficient documentation

## 2012-12-26 HISTORY — DX: Mental disorder, not otherwise specified: F99

## 2012-12-26 HISTORY — DX: Unspecified convulsions: R56.9

## 2012-12-26 HISTORY — DX: Headache: R51

## 2012-12-26 HISTORY — PX: CARPAL TUNNEL RELEASE: SHX101

## 2012-12-26 HISTORY — DX: Other specified postprocedural states: Z98.890

## 2012-12-26 HISTORY — DX: Carpal tunnel syndrome, left upper limb: G56.02

## 2012-12-26 HISTORY — DX: Sleep apnea, unspecified: G47.30

## 2012-12-26 HISTORY — DX: Anxiety disorder, unspecified: F41.9

## 2012-12-26 SURGERY — CARPAL TUNNEL RELEASE
Anesthesia: General | Laterality: Left | Wound class: Clean

## 2012-12-26 MED ORDER — ONDANSETRON HCL 4 MG/2ML IJ SOLN
INTRAMUSCULAR | Status: DC | PRN
  Administered 2012-12-26: 4 mg via INTRAVENOUS

## 2012-12-26 MED ORDER — MIDAZOLAM HCL 2 MG/2ML IJ SOLN: 1.0000 mg | INTRAMUSCULAR | Status: DC | PRN

## 2012-12-26 MED ORDER — OXYCODONE HCL 5 MG PO TABS
5.0000 mg | ORAL_TABLET | Freq: Once | ORAL | Status: AC | PRN
Start: 2012-12-26 — End: 2012-12-26
  Administered 2012-12-26: 5 mg via ORAL

## 2012-12-26 MED ORDER — FENTANYL CITRATE 0.05 MG/ML IJ SOLN: 50.0000 ug | INTRAMUSCULAR | Status: DC | PRN

## 2012-12-26 MED ORDER — DEXAMETHASONE SODIUM PHOSPHATE 4 MG/ML IJ SOLN
INTRAMUSCULAR | Status: DC | PRN
  Administered 2012-12-26: 10 mg via INTRAVENOUS

## 2012-12-26 MED ORDER — CEFAZOLIN SODIUM-DEXTROSE 2-3 GM-% IV SOLR
2.0000 g | INTRAVENOUS | Status: AC
  Administered 2012-12-26: 2 g via INTRAVENOUS

## 2012-12-26 MED ORDER — PROPOFOL 10 MG/ML IV BOLUS
INTRAVENOUS | Status: DC | PRN
  Administered 2012-12-26: 200 mg via INTRAVENOUS

## 2012-12-26 MED ORDER — LACTATED RINGERS IV SOLN
INTRAVENOUS | Status: DC
  Administered 2012-12-26 (×2): via INTRAVENOUS

## 2012-12-26 MED ORDER — HYDROMORPHONE HCL PF 1 MG/ML IJ SOLN
0.2500 mg | INTRAMUSCULAR | Status: DC | PRN
  Administered 2012-12-26 (×3): 0.5 mg via INTRAVENOUS

## 2012-12-26 MED ORDER — CHLORHEXIDINE GLUCONATE 4 % EX LIQD: 60.0000 mL | Freq: Once | CUTANEOUS | Status: DC

## 2012-12-26 MED ORDER — MIDAZOLAM HCL 5 MG/5ML IJ SOLN
INTRAMUSCULAR | Status: DC | PRN
  Administered 2012-12-26: 1 mg via INTRAVENOUS

## 2012-12-26 MED ORDER — HYDROCODONE-ACETAMINOPHEN 5-325 MG PO TABS: 1.0000 | ORAL_TABLET | Freq: Four times a day (QID) | ORAL | Status: DC | PRN

## 2012-12-26 MED ORDER — LIDOCAINE HCL (CARDIAC) 20 MG/ML IV SOLN
INTRAVENOUS | Status: DC | PRN
  Administered 2012-12-26: 50 mg via INTRAVENOUS

## 2012-12-26 MED ORDER — 0.9 % SODIUM CHLORIDE (POUR BTL) OPTIME
TOPICAL | Status: DC | PRN
  Administered 2012-12-26: 1000 mL

## 2012-12-26 MED ORDER — CEFAZOLIN SODIUM-DEXTROSE 2-3 GM-% IV SOLR: 2.0000 g | INTRAVENOUS | Status: DC

## 2012-12-26 MED ORDER — MIDAZOLAM HCL 2 MG/ML PO SYRP: 12.0000 mg | ORAL_SOLUTION | Freq: Once | ORAL | Status: DC | PRN

## 2012-12-26 MED ORDER — BUPIVACAINE HCL (PF) 0.25 % IJ SOLN
INTRAMUSCULAR | Status: DC | PRN
  Administered 2012-12-26: 9 mL

## 2012-12-26 MED ORDER — OXYCODONE HCL 5 MG/5ML PO SOLN: 5.0000 mg | Freq: Once | ORAL | Status: AC | PRN

## 2012-12-26 MED ORDER — FENTANYL CITRATE 0.05 MG/ML IJ SOLN
INTRAMUSCULAR | Status: DC | PRN
  Administered 2012-12-26: 100 ug via INTRAVENOUS

## 2012-12-26 SURGICAL SUPPLY — 40 items
BANDAGE GAUZE ELAST BULKY 4 IN (GAUZE/BANDAGES/DRESSINGS) ×2 IMPLANT
BLADE SURG 15 STRL LF DISP TIS (BLADE) ×1 IMPLANT
BLADE SURG 15 STRL SS (BLADE) ×1
BNDG COHESIVE 3X5 TAN STRL LF (GAUZE/BANDAGES/DRESSINGS) ×2 IMPLANT
BNDG ESMARK 4X9 LF (GAUZE/BANDAGES/DRESSINGS) ×2 IMPLANT
CHLORAPREP W/TINT 26ML (MISCELLANEOUS) ×2 IMPLANT
CLOTH BEACON ORANGE TIMEOUT ST (SAFETY) ×2 IMPLANT
CORDS BIPOLAR (ELECTRODE) ×2 IMPLANT
COVER MAYO STAND STRL (DRAPES) ×2 IMPLANT
COVER TABLE BACK 60X90 (DRAPES) ×2 IMPLANT
CUFF TOURNIQUET SINGLE 18IN (TOURNIQUET CUFF) ×2 IMPLANT
DRAPE EXTREMITY T 121X128X90 (DRAPE) ×2 IMPLANT
DRAPE SURG 17X23 STRL (DRAPES) ×2 IMPLANT
DRSG KUZMA FLUFF (GAUZE/BANDAGES/DRESSINGS) ×2 IMPLANT
GAUZE XEROFORM 1X8 LF (GAUZE/BANDAGES/DRESSINGS) ×2 IMPLANT
GLOVE BIO SURGEON STRL SZ 6.5 (GLOVE) IMPLANT
GLOVE BIO SURGEON STRL SZ7.5 (GLOVE) ×2 IMPLANT
GLOVE BIOGEL M STRL SZ7.5 (GLOVE) ×2 IMPLANT
GLOVE BIOGEL PI IND STRL 8 (GLOVE) ×2 IMPLANT
GLOVE BIOGEL PI IND STRL 8.5 (GLOVE) ×1 IMPLANT
GLOVE BIOGEL PI INDICATOR 8 (GLOVE) ×2
GLOVE BIOGEL PI INDICATOR 8.5 (GLOVE) ×1
GLOVE SURG ORTHO 8.0 STRL STRW (GLOVE) ×2 IMPLANT
GOWN BRE IMP PREV XXLGXLNG (GOWN DISPOSABLE) ×6 IMPLANT
GOWN PREVENTION PLUS XLARGE (GOWN DISPOSABLE) IMPLANT
NEEDLE 27GAX1X1/2 (NEEDLE) ×2 IMPLANT
NS IRRIG 1000ML POUR BTL (IV SOLUTION) ×2 IMPLANT
PACK BASIN DAY SURGERY FS (CUSTOM PROCEDURE TRAY) ×2 IMPLANT
PAD CAST 3X4 CTTN HI CHSV (CAST SUPPLIES) ×1 IMPLANT
PADDING CAST ABS 4INX4YD NS (CAST SUPPLIES) ×1
PADDING CAST ABS COTTON 4X4 ST (CAST SUPPLIES) ×1 IMPLANT
PADDING CAST COTTON 3X4 STRL (CAST SUPPLIES) ×1
SPONGE GAUZE 4X4 12PLY (GAUZE/BANDAGES/DRESSINGS) ×2 IMPLANT
STOCKINETTE 4X48 STRL (DRAPES) ×2 IMPLANT
SUT VICRYL 4-0 PS2 18IN ABS (SUTURE) IMPLANT
SUT VICRYL RAPIDE 4/0 PS 2 (SUTURE) ×2 IMPLANT
SYR BULB 3OZ (MISCELLANEOUS) ×2 IMPLANT
SYR CONTROL 10ML LL (SYRINGE) ×2 IMPLANT
TOWEL OR 17X24 6PK STRL BLUE (TOWEL DISPOSABLE) ×2 IMPLANT
UNDERPAD 30X30 INCONTINENT (UNDERPADS AND DIAPERS) ×2 IMPLANT

## 2012-12-26 NOTE — Anesthesia Preprocedure Evaluation (Signed)
Anesthesia Evaluation  Patient identified by MRN, date of birth, ID band Patient awake    Reviewed: Allergy & Precautions, H&P , NPO status , Patient's Chart, lab work & pertinent test results  Airway Mallampati: II TM Distance: >3 FB Neck ROM: Full    Dental no notable dental hx. (+) Teeth Intact, Chipped and Dental Advisory Given   Pulmonary asthma , sleep apnea and Continuous Positive Airway Pressure Ventilation ,  breath sounds clear to auscultation  Pulmonary exam normal       Cardiovascular negative cardio ROS  Rhythm:Regular Rate:Normal     Neuro/Psych  Headaches, Seizures -, Well Controlled,  PSYCHIATRIC DISORDERS  Neuromuscular disease    GI/Hepatic negative GI ROS, Neg liver ROS,   Endo/Other  negative endocrine ROS  Renal/GU negative Renal ROS  negative genitourinary   Musculoskeletal   Abdominal   Peds  Hematology negative hematology ROS (+)   Anesthesia Other Findings   Reproductive/Obstetrics negative OB ROS                           Anesthesia Physical Anesthesia Plan  ASA: III  Anesthesia Plan: General   Post-op Pain Management:    Induction: Intravenous  Airway Management Planned: LMA  Additional Equipment:   Intra-op Plan:   Post-operative Plan: Extubation in OR  Informed Consent: I have reviewed the patients History and Physical, chart, labs and discussed the procedure including the risks, benefits and alternatives for the proposed anesthesia with the patient or authorized representative who has indicated his/her understanding and acceptance.   Dental advisory given  Plan Discussed with: CRNA  Anesthesia Plan Comments:         Anesthesia Quick Evaluation

## 2012-12-26 NOTE — Transfer of Care (Signed)
Immediate Anesthesia Transfer of Care Note  Patient: Alyssa Schwartz  Procedure(s) Performed: Procedure(s): CARPAL TUNNEL RELEASE (Left)  Patient Location: PACU  Anesthesia Type:General  Level of Consciousness: awake and alert   Airway & Oxygen Therapy: Patient Spontanous Breathing and Patient connected to face mask oxygen  Post-op Assessment: Report given to PACU RN and Post -op Vital signs reviewed and stable  Post vital signs: Reviewed and stable  Complications: No apparent anesthesia complications
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
## 2012-12-26 NOTE — Anesthesia Postprocedure Evaluation (Signed)
  Anesthesia Post-op Note  Patient: Alyssa Schwartz  Procedure(s) Performed: Procedure(s): CARPAL TUNNEL RELEASE (Left)  Patient Location: PACU  Anesthesia Type:General  Level of Consciousness: awake, alert  and oriented  Airway and Oxygen Therapy: Patient Spontanous Breathing  Post-op Pain: mild  Post-op Assessment: Post-op Vital signs reviewed, Patient's Cardiovascular Status Stable, Respiratory Function Stable, Patent Airway and No signs of Nausea or vomiting  Post-op Vital Signs: Reviewed and stable  Complications: No apparent anesthesia complications

## 2012-12-26 NOTE — Anesthesia Procedure Notes (Signed)
Procedure Name: LMA Insertion Date/Time: 12/26/2012 2:09 PM Performed by: Zenia Resides D Pre-anesthesia Checklist: Patient identified, Emergency Drugs available, Suction available and Patient being monitored Patient Re-evaluated:Patient Re-evaluated prior to inductionOxygen Delivery Method: Circle System Utilized Preoxygenation: Pre-oxygenation with 100% oxygen Intubation Type: IV induction Ventilation: Mask ventilation without difficulty LMA: LMA inserted LMA Size: 4.0 Number of attempts: 1 Airway Equipment and Method: bite block Placement Confirmation: positive ETCO2 and breath sounds checked- equal and bilateral Tube secured with: Tape Dental Injury: Teeth and Oropharynx as per pre-operative assessment

## 2012-12-26 NOTE — Brief Op Note (Signed)
12/26/2012  2:33 PM  PATIENT:  Tytionna D Basden  52 y.o. female  PRE-OPERATIVE DIAGNOSIS:  LEFT CARPAL TUNNEL SYNDROME  POST-OPERATIVE DIAGNOSIS:  LEFT CARPAL TUNNEL SYNDROME  PROCEDURE:  Procedure(s): CARPAL TUNNEL RELEASE (Left)  SURGEON:  Surgeon(s) and Role:    * Nicki Reaper, MD - Primary    * Tami Ribas, MD - Assisting  PHYSICIAN ASSISTANT:   ASSISTANTS: K Kuzma,MD   ANESTHESIA:   local and general  EBL:  Total I/O In: 1000 [I.V.:1000] Out: -   BLOOD ADMINISTERED:none  DRAINS: none   LOCAL MEDICATIONS USED:  MARCAINE     SPECIMEN:  No Specimen  DISPOSITION OF SPECIMEN:  N/A  COUNTS:  YES  TOURNIQUET:   Total Tourniquet Time Documented: Forearm (Left) - 10 minutes Total: Forearm (Left) - 10 minutes   DICTATION: .Other Dictation: Dictation Number [id number]  PLAN OF CARE: Discharge to home after PACU  PATIENT DISPOSITION:  PACU - hemodynamically stable.

## 2012-12-26 NOTE — Op Note (Signed)
Dictation Number [id number]

## 2012-12-29 ENCOUNTER — Encounter (HOSPITAL_BASED_OUTPATIENT_CLINIC_OR_DEPARTMENT_OTHER): Payer: Self-pay | Admitting: Orthopedic Surgery

## 2012-12-29 NOTE — Op Note (Signed)
Alyssa Schwartz, Alyssa Schwartz NO.:  [id number]  MEDICAL RECORD NO.:  [id number]  LOCATION:                                 FACILITY:  PHYSICIAN:  Cindee Salt, M.D.       DATE OF BIRTH:  [birth year]  DATE OF PROCEDURE:  12/26/2012 DATE OF DISCHARGE:                              OPERATIVE REPORT   PREOPERATIVE DIAGNOSIS:  Carpal tunnel syndrome, left hand.  POSTOPERATIVE DIAGNOSIS:  Carpal tunnel syndrome, left hand.  PROCEDURE:  Decompression of left median nerve.  SURGEON:  Cindee Salt, M.D.  ASSISTANT:  Betha Loa, MD  ANESTHESIA:  General with local infiltration.  ANESTHESIOLOGIST:  Zenon Mayo, MD  HISTORY:  The patient is a 52 year old female with a history of arm pain, nerve conductions reveal carpal tunnel syndrome, left side.  Pre, peri, and postoperative course have been discussed along with risks and complications.  She is aware that there is no guarantee with the surgery; possibility of infection; recurrence of injury to arteries, nerves, tendons, incomplete relief of symptoms, dystrophy.  In the preoperative area, the patient is seen, the extremity was marked by both patient and surgeon.  Antibiotic given.  PROCEDURE IN DETAIL:  The patient was brought to the operating room where a general anesthetic was carried out without difficulty.  She was prepped using ChloraPrep, supine position with the left arm free.  A 3- minute dry time was allowed.  Time-out taken, confirming patient and procedure.  The limb was exsanguinated with an Esmarch bandage. Tourniquet was placed and the arm was inflated to 250 mmHg.  A longitudinal incision was made in the left palm carried down through subcutaneous tissue.  Bleeders electrocauterized with bipolar.  Palmar fascia was split.  Superficial palmar arch identified, the flexor tendon to the ring and little finger identified.  To the ulnar side of the median nerve, the carpal retinaculum was incised with sharp  dissection. Right angle and Sewall retractor were placed between skin and forearm fascia.  The fascia was released for approximately a 1.5 cm proximal to the wrist crease under direct vision.  Canal was explored.  Motor branch was noted to enter into muscle distally.  No further lesions were identified.  The wound was irrigated.  The skin was closed with interrupted 4-0 Vicryl Rapide sutures.  A local infiltration with 0.25% Marcaine without epinephrine was given, 9 mL was used. Sterile compressive dressing with fingers free was applied.  On deflation of the tourniquet, all fingers immediately pinked.  She was taken to the recovery room for observation in satisfactory condition. She will be discharged home to return in [facility] [place] in 1 week on Vicodin.          ______________________________ Cindee Salt, M.D.     GK/MEDQ  D:  12/26/2012  T:  12/27/2012  Job:  [id number]

## 2013-01-09 ENCOUNTER — Other Ambulatory Visit: Payer: Self-pay | Admitting: Orthopedic Surgery

## 2013-01-09 ENCOUNTER — Encounter (HOSPITAL_BASED_OUTPATIENT_CLINIC_OR_DEPARTMENT_OTHER): Payer: Self-pay | Admitting: *Deleted

## 2013-01-12 ENCOUNTER — Encounter (HOSPITAL_BASED_OUTPATIENT_CLINIC_OR_DEPARTMENT_OTHER): Payer: Self-pay | Admitting: *Deleted

## 2013-01-12 ENCOUNTER — Encounter (HOSPITAL_BASED_OUTPATIENT_CLINIC_OR_DEPARTMENT_OTHER): Payer: Self-pay | Admitting: Anesthesiology

## 2013-01-12 ENCOUNTER — Ambulatory Visit (HOSPITAL_BASED_OUTPATIENT_CLINIC_OR_DEPARTMENT_OTHER): Payer: Medicare Other | Admitting: Anesthesiology

## 2013-01-12 ENCOUNTER — Encounter (HOSPITAL_BASED_OUTPATIENT_CLINIC_OR_DEPARTMENT_OTHER)
Admission: RE | Disposition: A | Discharge status: Home / Self Care | Payer: Self-pay | Source: Ambulatory Visit | Attending: Orthopedic Surgery

## 2013-01-12 ENCOUNTER — Ambulatory Visit (HOSPITAL_BASED_OUTPATIENT_CLINIC_OR_DEPARTMENT_OTHER)
Admission: RE | Admit: 2013-01-12 | Discharge: 2013-01-12 | Disposition: A | Discharge status: Home / Self Care | Payer: Medicare Other | Source: Ambulatory Visit | Attending: Orthopedic Surgery | Admitting: Orthopedic Surgery

## 2013-01-12 DIAGNOSIS — F329 Major depressive disorder, single episode, unspecified: Secondary | ICD-10-CM | POA: Insufficient documentation

## 2013-01-12 DIAGNOSIS — G5600 Carpal tunnel syndrome, unspecified upper limb: Secondary | ICD-10-CM | POA: Insufficient documentation

## 2013-01-12 DIAGNOSIS — E669 Obesity, unspecified: Secondary | ICD-10-CM | POA: Insufficient documentation

## 2013-01-12 DIAGNOSIS — F3289 Other specified depressive episodes: Secondary | ICD-10-CM | POA: Insufficient documentation

## 2013-01-12 DIAGNOSIS — F17200 Nicotine dependence, unspecified, uncomplicated: Secondary | ICD-10-CM | POA: Insufficient documentation

## 2013-01-12 DIAGNOSIS — K219 Gastro-esophageal reflux disease without esophagitis: Secondary | ICD-10-CM | POA: Insufficient documentation

## 2013-01-12 DIAGNOSIS — R569 Unspecified convulsions: Secondary | ICD-10-CM | POA: Insufficient documentation

## 2013-01-12 DIAGNOSIS — G4730 Sleep apnea, unspecified: Secondary | ICD-10-CM | POA: Insufficient documentation

## 2013-01-12 DIAGNOSIS — J45909 Unspecified asthma, uncomplicated: Secondary | ICD-10-CM | POA: Insufficient documentation

## 2013-01-12 DIAGNOSIS — Z6834 Body mass index (BMI) 34.0-34.9, adult: Secondary | ICD-10-CM | POA: Insufficient documentation

## 2013-01-12 HISTORY — PX: CARPAL TUNNEL RELEASE: SHX101

## 2013-01-12 LAB — POCT HEMOGLOBIN-HEMACUE: Hemoglobin: 13 g/dL (ref 12.0–15.0)

## 2013-01-12 SURGERY — CARPAL TUNNEL RELEASE
Anesthesia: General | Site: Wrist | Laterality: Right | Wound class: Clean

## 2013-01-12 MED ORDER — 0.9 % SODIUM CHLORIDE (POUR BTL) OPTIME
TOPICAL | Status: DC | PRN
  Administered 2013-01-12: 200 mL

## 2013-01-12 MED ORDER — OXYCODONE HCL 5 MG PO TABS: 5.0000 mg | ORAL_TABLET | Freq: Once | ORAL | Status: DC | PRN

## 2013-01-12 MED ORDER — CHLORHEXIDINE GLUCONATE 4 % EX LIQD: 60.0000 mL | Freq: Once | CUTANEOUS | Status: DC

## 2013-01-12 MED ORDER — CEFAZOLIN SODIUM-DEXTROSE 2-3 GM-% IV SOLR: 2.0000 g | INTRAVENOUS | Status: DC

## 2013-01-12 MED ORDER — FENTANYL CITRATE 0.05 MG/ML IJ SOLN: 50.0000 ug | INTRAMUSCULAR | Status: DC | PRN

## 2013-01-12 MED ORDER — CEFAZOLIN SODIUM-DEXTROSE 2-3 GM-% IV SOLR
2.0000 g | INTRAVENOUS | Status: AC
  Administered 2013-01-12: 2 g via INTRAVENOUS

## 2013-01-12 MED ORDER — MEPERIDINE HCL 25 MG/ML IJ SOLN: 6.2500 mg | INTRAMUSCULAR | Status: DC | PRN

## 2013-01-12 MED ORDER — LIDOCAINE HCL (CARDIAC) 20 MG/ML IV SOLN
INTRAVENOUS | Status: DC | PRN
  Administered 2013-01-12: 80 mg via INTRAVENOUS

## 2013-01-12 MED ORDER — BUPIVACAINE HCL (PF) 0.25 % IJ SOLN
INTRAMUSCULAR | Status: DC | PRN
  Administered 2013-01-12: 7 mL

## 2013-01-12 MED ORDER — MIDAZOLAM HCL 2 MG/2ML IJ SOLN: 1.0000 mg | INTRAMUSCULAR | Status: DC | PRN

## 2013-01-12 MED ORDER — PROMETHAZINE HCL 25 MG/ML IJ SOLN: 6.2500 mg | INTRAMUSCULAR | Status: DC | PRN

## 2013-01-12 MED ORDER — KETOROLAC TROMETHAMINE 30 MG/ML IJ SOLN
INTRAMUSCULAR | Status: DC | PRN
  Administered 2013-01-12: 30 mg via INTRAVENOUS

## 2013-01-12 MED ORDER — HYDROCODONE-ACETAMINOPHEN 5-325 MG PO TABS: 1.0000 | ORAL_TABLET | Freq: Four times a day (QID) | ORAL | Status: DC | PRN

## 2013-01-12 MED ORDER — FENTANYL CITRATE 0.05 MG/ML IJ SOLN
INTRAMUSCULAR | Status: DC | PRN
  Administered 2013-01-12: 100 ug via INTRAVENOUS

## 2013-01-12 MED ORDER — LACTATED RINGERS IV SOLN
INTRAVENOUS | Status: DC
  Administered 2013-01-12: 14:00:00 via INTRAVENOUS

## 2013-01-12 MED ORDER — OXYCODONE HCL 5 MG/5ML PO SOLN: 5.0000 mg | Freq: Once | ORAL | Status: DC | PRN

## 2013-01-12 MED ORDER — ONDANSETRON HCL 4 MG/2ML IJ SOLN
INTRAMUSCULAR | Status: DC | PRN
  Administered 2013-01-12: 4 mg via INTRAVENOUS

## 2013-01-12 MED ORDER — PROPOFOL 10 MG/ML IV BOLUS
INTRAVENOUS | Status: DC | PRN
  Administered 2013-01-12: 200 mg via INTRAVENOUS

## 2013-01-12 MED ORDER — FENTANYL CITRATE 0.05 MG/ML IJ SOLN
25.0000 ug | INTRAMUSCULAR | Status: DC | PRN
  Administered 2013-01-12: 25 ug via INTRAVENOUS
  Administered 2013-01-12: 50 ug via INTRAVENOUS

## 2013-01-12 MED ORDER — DEXAMETHASONE SODIUM PHOSPHATE 4 MG/ML IJ SOLN
INTRAMUSCULAR | Status: DC | PRN
Start: 2013-01-12 — End: 2013-01-12
  Administered 2013-01-12: 10 mg via INTRAVENOUS

## 2013-01-12 MED ORDER — MIDAZOLAM HCL 2 MG/2ML IJ SOLN: 0.5000 mg | Freq: Once | INTRAMUSCULAR | Status: DC | PRN

## 2013-01-12 SURGICAL SUPPLY — 40 items
BANDAGE GAUZE ELAST BULKY 4 IN (GAUZE/BANDAGES/DRESSINGS) ×2 IMPLANT
BLADE SURG 15 STRL LF DISP TIS (BLADE) ×1 IMPLANT
BLADE SURG 15 STRL SS (BLADE) ×1
BNDG COHESIVE 3X5 TAN STRL LF (GAUZE/BANDAGES/DRESSINGS) ×2 IMPLANT
BNDG ESMARK 4X9 LF (GAUZE/BANDAGES/DRESSINGS) ×2 IMPLANT
CHLORAPREP W/TINT 26ML (MISCELLANEOUS) ×2 IMPLANT
CLOTH BEACON ORANGE TIMEOUT ST (SAFETY) ×2 IMPLANT
CORDS BIPOLAR (ELECTRODE) ×2 IMPLANT
COVER MAYO STAND STRL (DRAPES) ×2 IMPLANT
COVER TABLE BACK 60X90 (DRAPES) ×2 IMPLANT
CUFF TOURNIQUET SINGLE 18IN (TOURNIQUET CUFF) ×2 IMPLANT
DRAPE EXTREMITY T 121X128X90 (DRAPE) ×2 IMPLANT
DRAPE SURG 17X23 STRL (DRAPES) ×2 IMPLANT
DRSG KUZMA FLUFF (GAUZE/BANDAGES/DRESSINGS) ×2 IMPLANT
GAUZE XEROFORM 1X8 LF (GAUZE/BANDAGES/DRESSINGS) ×2 IMPLANT
GLOVE BIO SURGEON STRL SZ 6.5 (GLOVE) ×2 IMPLANT
GLOVE BIO SURGEON STRL SZ7 (GLOVE) ×2 IMPLANT
GLOVE BIOGEL PI IND STRL 7.0 (GLOVE) ×1 IMPLANT
GLOVE BIOGEL PI IND STRL 8.5 (GLOVE) ×1 IMPLANT
GLOVE BIOGEL PI INDICATOR 7.0 (GLOVE) ×1
GLOVE BIOGEL PI INDICATOR 8.5 (GLOVE) ×1
GLOVE EXAM NITRILE LRG STRL (GLOVE) ×2 IMPLANT
GLOVE SURG ORTHO 8.0 STRL STRW (GLOVE) ×2 IMPLANT
GOWN BRE IMP PREV XXLGXLNG (GOWN DISPOSABLE) ×2 IMPLANT
GOWN PREVENTION PLUS XLARGE (GOWN DISPOSABLE) ×4 IMPLANT
NEEDLE 27GAX1X1/2 (NEEDLE) ×2 IMPLANT
NS IRRIG 1000ML POUR BTL (IV SOLUTION) ×2 IMPLANT
PACK BASIN DAY SURGERY FS (CUSTOM PROCEDURE TRAY) ×2 IMPLANT
PAD CAST 3X4 CTTN HI CHSV (CAST SUPPLIES) ×1 IMPLANT
PADDING CAST ABS 4INX4YD NS (CAST SUPPLIES) ×1
PADDING CAST ABS COTTON 4X4 ST (CAST SUPPLIES) ×1 IMPLANT
PADDING CAST COTTON 3X4 STRL (CAST SUPPLIES) ×1
SPONGE GAUZE 4X4 12PLY (GAUZE/BANDAGES/DRESSINGS) ×2 IMPLANT
STOCKINETTE 4X48 STRL (DRAPES) ×2 IMPLANT
SUT VICRYL 4-0 PS2 18IN ABS (SUTURE) IMPLANT
SUT VICRYL RAPIDE 4/0 PS 2 (SUTURE) ×2 IMPLANT
SYR BULB 3OZ (MISCELLANEOUS) ×2 IMPLANT
SYR CONTROL 10ML LL (SYRINGE) ×2 IMPLANT
TOWEL OR 17X24 6PK STRL BLUE (TOWEL DISPOSABLE) ×2 IMPLANT
UNDERPAD 30X30 INCONTINENT (UNDERPADS AND DIAPERS) ×2 IMPLANT

## 2013-01-12 NOTE — Op Note (Signed)
NAMEARRION, BURRUEL NO.:  [id number]  MEDICAL RECORD NO.:  [id number]  LOCATION:                                 FACILITY:  PHYSICIAN:  Cindee Salt, M.D.       DATE OF BIRTH:  1960/12/12  DATE OF PROCEDURE:  01/12/2013 DATE OF DISCHARGE:                              OPERATIVE REPORT   PREOPERATIVE DIAGNOSIS:  Carpal tunnel syndrome, right hand.  POSTOPERATIVE DIAGNOSIS:  Carpal tunnel syndrome, right hand.  OPERATION:  Decompression right median nerve.  SURGEON:  Cindee Salt, MD  ANESTHESIA:  General with local infiltration.  ANESTHESIOLOGIST:  Dr. Jean Rosenthal.  HISTORY:  The patient is a 52 year old female with a history of bilateral carpal tunnel syndrome.  She has undergone release on her left side, is admitted now for her right.  Pre, peri, and postoperative course have been discussed along with risks and complications.  She is aware that there is no guarantee with the surgery; possibility of infection, recurrence injury to arteries, nerves, tendons, incomplete relief of symptoms and dystrophy.  In the preoperative area, the patient is seen, the extremity marked by both the patient and surgeon. Antibiotic given.  PROCEDURE:  The patient was brought to the operating room where a general anesthetic was carried out without difficulty.  She was prepped using ChloraPrep, supine position with the right arm free.  A 3-minute dry time was allowed.  Time-out taken, confirming patient and procedure. The limb was exsanguinated with an Esmarch bandage.  Tourniquet placed high and the arm was inflated to 250 mmHg.  A longitudinal incision was made in the right palm and carried down through subcutaneous tissue. Bleeders were electrocauterized with bipolar.  The palmar fascia was split.  Superficial palmar arch identified.  The flexor tendon to the ring and little finger identified.  To the ulnar side of the median nerve, the carpal retinaculum was incised with sharp  dissection.  Right angle and Sewall retractor were placed between skin and forearm fascia. The fascia was released for approximately a centimeter and half proximal to the wrist crease under direct vision.  Canal was explored.  Area of compression to the nerve was apparent.  Motor branch entered into muscle.  No further lesions were identified.  The wound was copiously irrigated with saline and the skin closed with interrupted 4-0 Vicryl Rapide sutures.  Local infiltration with 0.25% Marcaine without epinephrine was given, 7 mL was used.  A sterile compressive dressing was applied with the fingers free.  On deflation of the tourniquet, all fingers immediately pinked.  She was taken to the recovery room for observation in satisfactory condition.  She will be discharged home to return to the [facility] in 1 week on Norco.          ______________________________ Cindee Salt, M.D.     GK/MEDQ  D:  01/12/2013  T:  01/12/2013  Job:  [id number]

## 2013-01-12 NOTE — Transfer of Care (Signed)
Immediate Anesthesia Transfer of Care Note  Patient: Alyssa Schwartz  Procedure(s) Performed: Procedure(s): Right Carpal Tunnel Release (Right)  Patient Location: PACU  Anesthesia Type:General  Level of Consciousness: awake and alert   Airway & Oxygen Therapy: Patient Spontanous Breathing and Patient connected to face mask oxygen  Post-op Assessment: Report given to PACU RN and Post -op Vital signs reviewed and stable  Post vital signs: Reviewed and stable  Complications: No apparent anesthesia complications

## 2013-01-12 NOTE — Interval H&P Note (Signed)
History and Physical Interval Note: Heart:reg Chest: clear Abd: soft  01/12/2013 2:11 PM  Alyssa Schwartz  has presented today for surgery, with the diagnosis of RIGHT CARPAL TUNNEL SYNDROME   The various methods of treatment have been discussed with the patient and family. After consideration of risks, benefits and other options for treatment, the patient has consented to  Procedure(s): RIGHT CARPAL TUNNEL RELEASE (Right) as a surgical intervention .  The patient's history has been reviewed, patient examined, no change in status, stable for surgery.  I have reviewed the patient's chart and labs.  Questions were answered to the patient's satisfaction.     [name] R

## 2013-01-12 NOTE — Anesthesia Postprocedure Evaluation (Signed)
  Anesthesia Post-op Note  Patient: Alyssa Schwartz  Procedure(s) Performed: Procedure(s): Right Carpal Tunnel Release (Right)  Patient Location: PACU  Anesthesia Type:General  Level of Consciousness: awake, alert , oriented and patient cooperative  Airway and Oxygen Therapy: Patient Spontanous Breathing  Post-op Pain: mild  Post-op Assessment: Post-op Vital signs reviewed, Patient's Cardiovascular Status Stable, Respiratory Function Stable, Patent Airway, No signs of Nausea or vomiting and Pain level controlled  Post-op Vital Signs: Reviewed and stable  Complications: No apparent anesthesia complications
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
## 2013-01-12 NOTE — Op Note (Signed)
Other Dictation: Dictation Number [id number]

## 2013-01-12 NOTE — Brief Op Note (Signed)
01/12/2013  4:57 PM  PATIENT:  Alyssa Schwartz  52 y.o. female  PRE-OPERATIVE DIAGNOSIS:  Right Carpal Tunnel Syndrome   POST-OPERATIVE DIAGNOSIS:  Right Carpal Tunnel Syndrome  PROCEDURE:  Procedure(s): Right Carpal Tunnel Release (Right)  SURGEON:  Surgeon(s) and Role:    * Nicki Reaper, MD - Primary  PHYSICIAN ASSISTANT:   ASSISTANTS: none   ANESTHESIA:   local and general  EBL:  Total I/O In: 1000 [I.V.:1000] Out: -   BLOOD ADMINISTERED:none  DRAINS: none   LOCAL MEDICATIONS USED:  MARCAINE     SPECIMEN:  No Specimen  DISPOSITION OF SPECIMEN:  N/A  COUNTS:  YES  TOURNIQUET:   Total Tourniquet Time Documented: Upper Arm (Right) - 12 minutes Total: Upper Arm (Right) - 12 minutes   DICTATION: .Other Dictation: Dictation Number [id number]  PLAN OF CARE: Discharge to home after PACU  PATIENT DISPOSITION:  PACU - hemodynamically stable.

## 2013-01-12 NOTE — Anesthesia Preprocedure Evaluation (Signed)
Anesthesia Evaluation  Patient identified by MRN, date of birth, ID band Patient awake    Reviewed: Allergy & Precautions, H&P , NPO status , Patient's Chart, lab work & pertinent test results  History of Anesthesia Complications Negative for: history of anesthetic complications  Airway Mallampati: II TM Distance: >3 FB Neck ROM: Full    Dental  (+) Chipped, Missing and Dental Advisory Given   Pulmonary asthma , sleep apnea and Continuous Positive Airway Pressure Ventilation , Current Smoker,  breath sounds clear to auscultation  Pulmonary exam normal       Cardiovascular negative cardio ROS  Rhythm:Regular Rate:Normal     Neuro/Psych  Headaches, Seizures -, Well Controlled and Poorly Controlled,  Anxiety    GI/Hepatic Neg liver ROS, GERD-  Medicated and Controlled,  Endo/Other  negative endocrine ROS  Renal/GU negative Renal ROS     Musculoskeletal   Abdominal (+) + obese,   Peds  Hematology   Anesthesia Other Findings   Reproductive/Obstetrics                           Anesthesia Physical Anesthesia Plan  ASA: III  Anesthesia Plan: General   Post-op Pain Management:    Induction: Intravenous  Airway Management Planned: LMA  Additional Equipment:   Intra-op Plan:   Post-operative Plan:   Informed Consent: I have reviewed the patients History and Physical, chart, labs and discussed the procedure including the risks, benefits and alternatives for the proposed anesthesia with the patient or authorized representative who has indicated his/her understanding and acceptance.   Dental advisory given  Plan Discussed with: CRNA and Surgeon  Anesthesia Plan Comments: (Plan routine monitors, GA- LMA OK)        Anesthesia Quick Evaluation

## 2013-01-12 NOTE — Anesthesia Procedure Notes (Signed)
Procedure Name: LMA Insertion Performed by: Meng Winterton W Pre-anesthesia Checklist: Patient identified, Timeout performed, Emergency Drugs available, Suction available and Patient being monitored Patient Re-evaluated:Patient Re-evaluated prior to inductionOxygen Delivery Method: Circle system utilized Preoxygenation: Pre-oxygenation with 100% oxygen Intubation Type: IV induction Ventilation: Mask ventilation without difficulty LMA: LMA inserted LMA Size: 5.0 Number of attempts: 1 Placement Confirmation: breath sounds checked- equal and bilateral and positive ETCO2 Tube secured with: Tape Dental Injury: Teeth and Oropharynx as per pre-operative assessment      

## 2013-01-13 ENCOUNTER — Encounter (HOSPITAL_BASED_OUTPATIENT_CLINIC_OR_DEPARTMENT_OTHER): Payer: Self-pay | Admitting: Orthopedic Surgery

## 2013-02-16 ENCOUNTER — Encounter (HOSPITAL_COMMUNITY): Payer: Self-pay | Admitting: Emergency Medicine

## 2013-02-16 ENCOUNTER — Emergency Department (HOSPITAL_COMMUNITY)
Admission: EM | Admit: 2013-02-16 | Discharge: 2013-02-16 | Disposition: A | Discharge status: Home / Self Care | Payer: Medicare Other | Attending: Emergency Medicine | Admitting: Emergency Medicine

## 2013-02-16 ENCOUNTER — Emergency Department (HOSPITAL_COMMUNITY): Payer: Medicare Other

## 2013-02-16 ENCOUNTER — Telehealth (HOSPITAL_COMMUNITY): Payer: Self-pay | Admitting: Emergency Medicine

## 2013-02-16 DIAGNOSIS — F411 Generalized anxiety disorder: Secondary | ICD-10-CM | POA: Insufficient documentation

## 2013-02-16 DIAGNOSIS — G40909 Epilepsy, unspecified, not intractable, without status epilepticus: Secondary | ICD-10-CM | POA: Insufficient documentation

## 2013-02-16 DIAGNOSIS — R21 Rash and other nonspecific skin eruption: Secondary | ICD-10-CM

## 2013-02-16 DIAGNOSIS — Z79899 Other long term (current) drug therapy: Secondary | ICD-10-CM | POA: Insufficient documentation

## 2013-02-16 DIAGNOSIS — G43909 Migraine, unspecified, not intractable, without status migrainosus: Secondary | ICD-10-CM | POA: Insufficient documentation

## 2013-02-16 DIAGNOSIS — F17200 Nicotine dependence, unspecified, uncomplicated: Secondary | ICD-10-CM | POA: Insufficient documentation

## 2013-02-16 DIAGNOSIS — R059 Cough, unspecified: Secondary | ICD-10-CM | POA: Insufficient documentation

## 2013-02-16 DIAGNOSIS — R05 Cough: Secondary | ICD-10-CM

## 2013-02-16 DIAGNOSIS — Z760 Encounter for issue of repeat prescription: Secondary | ICD-10-CM | POA: Insufficient documentation

## 2013-02-16 DIAGNOSIS — M549 Dorsalgia, unspecified: Secondary | ICD-10-CM | POA: Insufficient documentation

## 2013-02-16 DIAGNOSIS — G4730 Sleep apnea, unspecified: Secondary | ICD-10-CM | POA: Insufficient documentation

## 2013-02-16 DIAGNOSIS — Z872 Personal history of diseases of the skin and subcutaneous tissue: Secondary | ICD-10-CM | POA: Insufficient documentation

## 2013-02-16 DIAGNOSIS — Z8669 Personal history of other diseases of the nervous system and sense organs: Secondary | ICD-10-CM | POA: Insufficient documentation

## 2013-02-16 DIAGNOSIS — J45909 Unspecified asthma, uncomplicated: Secondary | ICD-10-CM | POA: Insufficient documentation

## 2013-02-16 DIAGNOSIS — Z8659 Personal history of other mental and behavioral disorders: Secondary | ICD-10-CM | POA: Insufficient documentation

## 2013-02-16 MED ORDER — PERMETHRIN 5 % EX CREA: TOPICAL_CREAM | CUTANEOUS | Status: DC

## 2013-02-16 MED ORDER — SERTRALINE HCL 50 MG PO TABS: 100.0000 mg | ORAL_TABLET | Freq: Three times a day (TID) | ORAL | Status: DC

## 2013-02-16 MED ORDER — CYCLOBENZAPRINE HCL 10 MG PO TABS: 10.0000 mg | ORAL_TABLET | Freq: Two times a day (BID) | ORAL | Status: DC | PRN

## 2013-02-16 NOTE — ED Notes (Signed)
Pt d.c home in NAD. Signature pad unavailable, pt verbalized understanding of d/c instructions and follow up care.Marland Kitchen

## 2013-02-16 NOTE — ED Notes (Signed)
Pt c/o back pain worse with cough and movement x 5 days; pt sts productive cough x 5 days also

## 2013-02-16 NOTE — ED Provider Notes (Signed)
History     CSN: [id number]  Arrival date & time 02/16/13  9562   First MD Initiated Contact with Patient 02/16/13 1209      Chief Complaint  Patient presents with  . Back Pain  . Cough    (Consider location/radiation/quality/duration/timing/severity/associated sxs/prior treatment) HPI  Patient is a 52 year old female presenting with multiple complaints. Patient states the complaint that brought her in with some right-sided throbbing constant 7/10 back pain that began 5 days ago at the worse with movement. Patient denies any weakness, numbness or tingling down the extremities, decreased strength. Patient also with complaint of pruritic non draining rash on left hand and on lower back that is worse at night. Denies exposure to bed bugs or scabies. Denies fevers, chills, nausea, vomiting, abdominal pain, diarrhea, shortness of breath, chest pain.  Past Medical History  Diagnosis Date  . Nerve damage     hands  . Plantar fasciitis     bilateral  . Seizures     seizure disorder; last seizure 1 yr. ago  . Headache     migraines  . Anxiety   . Mental disorder     anger issues  . Asthma     daily inhalers  . History of foot surgery     2 surgeries on left foot, 3 on right foot  . Carpal tunnel syndrome of left wrist 12/2012  . Sleep apnea     uses CPAP nightly    Past Surgical History  Procedure Laterality Date  . Elbow surgery Left   . Plantar fascia release    . Bunionectomy    . Hammer toe surgery    . Carpal tunnel release Left 12/26/2012    Procedure: CARPAL TUNNEL RELEASE;  Surgeon: Nicki Reaper, MD;  Location: [facility];  Service: Orthopedics;  Laterality: Left;  . Carpal tunnel release Right 01/12/2013    Procedure: Right Carpal Tunnel Release;  Surgeon: Nicki Reaper, MD;  Location: [facility];  Service: Orthopedics;  Laterality: Right;    History reviewed. No pertinent family history.  History  Substance Use Topics  . Smoking  status: Current Every Day Smoker -- 0.50 packs/day for 30 years    Types: Cigarettes  . Smokeless tobacco: Never Used     Comment: 9 cig./day  . Alcohol Use: Yes     Comment: socially    OB History   Grav Para Term Preterm Abortions TAB SAB Ect Mult Living                  Review of Systems  Constitutional: Negative for fever and chills.  HENT: Negative.   Cardiovascular: Negative.   Gastrointestinal: Negative.   Genitourinary: Negative.   Musculoskeletal: Positive for back pain.  Skin: Positive for rash.  Neurological: Negative for dizziness, weakness, numbness and headaches.    Allergies  Review of patient's allergies indicates no known allergies.  Home Medications   Current Outpatient Rx  Name  Route  Sig  Dispense  Refill  . albuterol (PROVENTIL HFA;VENTOLIN HFA) 108 (90 BASE) MCG/ACT inhaler   Inhalation   Inhale 2 puffs into the lungs every 6 (six) hours as needed. For shortness of breath or wheeze          . budesonide-formoterol (SYMBICORT) 160-4.5 MCG/ACT inhaler   Inhalation   Inhale 2 puffs into the lungs 2 (two) times daily.         . cholecalciferol (VITAMIN D) 1000 UNITS tablet  Oral   Take 1,000 Units by mouth daily.         Marland Kitchen gabapentin (NEURONTIN) 100 MG capsule   Oral   Take 100 mg by mouth 3 (three) times daily.          Marland Kitchen HYDROcodone-acetaminophen (NORCO) 5-325 MG per tablet   Oral   Take 1 tablet by mouth every 6 (six) hours as needed for pain.   30 tablet   0   . levonorgestrel (MIRENA) 20 MCG/24HR IUD   Intrauterine   1 each by Intrauterine route once.         . phenytoin (DILANTIN) 100 MG ER capsule   Oral   Take by mouth 3 (three) times daily.         . pregabalin (LYRICA) 100 MG capsule   Oral   Take 100 mg by mouth 3 (three) times daily.          . QUEtiapine Fumarate (SEROQUEL PO)   Oral   Take 1 tablet by mouth at bedtime.         . sertraline (ZOLOFT) 100 MG tablet   Oral   Take 100 mg by mouth 3  (three) times daily.         Marland Kitchen zolpidem (AMBIEN) 5 MG tablet   Oral   Take 5 mg by mouth at bedtime as needed for sleep.           BP 118/89  Pulse 78  Temp(Src) 98.8 F (37.1 C) (Oral)  Resp 18  SpO2 96%  Physical Exam  Constitutional: She is oriented to person, place, and time. She appears well-developed and well-nourished.  HENT:  Head: Normocephalic and atraumatic.  Mouth/Throat: Oropharynx is clear and moist.  Eyes: EOM are normal. Pupils are equal, round, and reactive to light.  Neck: Normal range of motion. Neck supple.  Cardiovascular: Normal rate, regular rhythm and normal heart sounds.   Pulmonary/Chest: Effort normal and breath sounds normal. No respiratory distress. She has no wheezes. She has no rales.  Abdominal: Soft. Bowel sounds are normal.  Musculoskeletal: Normal range of motion.  Neurological: She is alert and oriented to person, place, and time. No cranial nerve deficit. She exhibits normal muscle tone. Coordination normal.  Skin: Skin is warm and dry.  Psychiatric: She has a normal mood and affect.    ED Course  Procedures (including critical care time)  Patient complains of productive cough to triage, but did not address cough as a concern during my interaction with patient.   Labs Reviewed - No data to display Dg Chest 2 View  02/16/2013  *RADIOLOGY REPORT*  Clinical Data: 52 year old female back pain and cough.  CHEST - 2 VIEW  Comparison: 11/19/2012 and earlier.  Findings: Lung volumes are stable within normal limits.  Cardiac size and mediastinal contours are within normal limits.  Visualized tracheal air column is within normal limits.  No pneumothorax, pulmonary edema, pleural effusion or confluent pulmonary opacity. No acute osseous abnormality identified.  IMPRESSION: No acute cardiopulmonary abnormality.   Original Report Authenticated By: Erskine Speed, M.D.      1. Back pain   2. Cough   3. Medication refill   4. Rash, skin        MDM  Patient with back pain.  No neurological deficits and normal neuro exam.  Patient can walk but states is painful.  No loss of bowel or bladder control.  No concern for cauda equina.  No fever, night sweats, weight  loss, h/o cancer, IVDU.  RICE protocol and pain medicine indicated and discussed with patient. No evidence of SJS or necrotizing fasciitis. Due to pruritic and not painful nature of blisters do not suspect pemphigus vulgaris. Pustules do not resemble scabies as per pt hx or allergic reaction. Drained one pustule with 18 gauge needle, covered with bandaid. At this time there does not appear to be any evidence of an acute emergency medical condition and the patient appears stable for discharge with appropriate outpatient follow up.  Provided limited refill of Zoloft, declined to refill Seroquel as dosing was not in computer and patient unable to tell dosing. Diagnosis and warning signs of increasing infection were discussed with patient who verbalizes understanding and is agreeable to discharge. Patient is stable at time of discharge          Jeannetta Ellis, PA-C 02/16/13 1325

## 2013-02-17 NOTE — ED Provider Notes (Signed)
Medical screening examination/treatment/procedure(s) were performed by non-physician practitioner and as supervising physician I was immediately available for consultation/collaboration.   Carleene Cooper III, MD 02/17/13 1059

## 2013-08-12 ENCOUNTER — Encounter: Payer: Self-pay | Admitting: Podiatry

## 2013-08-12 ENCOUNTER — Ambulatory Visit (INDEPENDENT_AMBULATORY_CARE_PROVIDER_SITE_OTHER): Payer: Medicare Other

## 2013-08-12 ENCOUNTER — Ambulatory Visit (INDEPENDENT_AMBULATORY_CARE_PROVIDER_SITE_OTHER): Payer: Medicare Other | Admitting: Podiatry

## 2013-08-12 VITALS — BP 103/74 | HR 96 | Resp 12 | Ht 71.0 in | Wt 216.0 lb

## 2013-08-12 DIAGNOSIS — M722 Plantar fascial fibromatosis: Secondary | ICD-10-CM

## 2013-08-12 DIAGNOSIS — M2040 Other hammer toe(s) (acquired), unspecified foot: Secondary | ICD-10-CM

## 2013-08-12 MED ORDER — HYDROCODONE-ACETAMINOPHEN 10-325 MG PO TABS: 1.0000 | ORAL_TABLET | Freq: Three times a day (TID) | ORAL | Status: DC | PRN

## 2013-08-12 NOTE — Patient Instructions (Signed)
don't eat midnight before surgery

## 2013-08-12 NOTE — Progress Notes (Signed)
Subjective:     Patient ID: Alyssa Schwartz, female   DOB: 12/29/1960, 52 y.o.   MRN: [id number]  Foot Pain   patient presents stating my heels are slowly doing better he still gets sore at night. I'm having increased trouble with his left second toe is a start to wear different shoes. It is too long and gets sore on top   Review of Systems  All other systems reviewed and are negative.       Objective:   Physical Exam  Nursing note and vitals reviewed. Cardiovascular: Intact distal pulses.   Musculoskeletal: Normal range of motion.  Neurological: She is alert.  Skin: Skin is warm.   patient's heels are doing very well postoperatively with mild edema and no pain when pressed. The second toe left is elongated and lifted and painful when pressed    Assessment:     Healing well from fascial surgery bilateral with pain still present at nighttime. Hammertoe deformity second toe left with structural deformity noted    Plan:     X-rays reviewed of the left foot and continued hydrocodone usage to control discomfort she experiences. Discussed correction of the second toe left patient wants to have this fixed. Allowed patient to read consent form for correction explaining all possible complications and the fact total recovery will take 6 months to one year. Have recommended digital fusion digit 2 left with an explaining the toe will be stiff but will be straight and functional scheduled for surgery in the next 2 weeks

## 2013-08-14 ENCOUNTER — Emergency Department (HOSPITAL_COMMUNITY): Payer: Medicare Other

## 2013-08-14 ENCOUNTER — Encounter (HOSPITAL_COMMUNITY): Payer: Self-pay | Admitting: Emergency Medicine

## 2013-08-14 ENCOUNTER — Emergency Department (HOSPITAL_COMMUNITY)
Admission: EM | Admit: 2013-08-14 | Discharge: 2013-08-14 | Disposition: A | Discharge status: Home / Self Care | Payer: Medicare Other | Attending: Emergency Medicine | Admitting: Emergency Medicine

## 2013-08-14 DIAGNOSIS — Z79899 Other long term (current) drug therapy: Secondary | ICD-10-CM | POA: Insufficient documentation

## 2013-08-14 DIAGNOSIS — Y9389 Activity, other specified: Secondary | ICD-10-CM | POA: Insufficient documentation

## 2013-08-14 DIAGNOSIS — G8929 Other chronic pain: Secondary | ICD-10-CM | POA: Insufficient documentation

## 2013-08-14 DIAGNOSIS — S63619A Unspecified sprain of unspecified finger, initial encounter: Secondary | ICD-10-CM

## 2013-08-14 DIAGNOSIS — F17200 Nicotine dependence, unspecified, uncomplicated: Secondary | ICD-10-CM | POA: Insufficient documentation

## 2013-08-14 DIAGNOSIS — M79609 Pain in unspecified limb: Secondary | ICD-10-CM | POA: Insufficient documentation

## 2013-08-14 DIAGNOSIS — IMO0002 Reserved for concepts with insufficient information to code with codable children: Secondary | ICD-10-CM | POA: Insufficient documentation

## 2013-08-14 DIAGNOSIS — Y9289 Other specified places as the place of occurrence of the external cause: Secondary | ICD-10-CM | POA: Insufficient documentation

## 2013-08-14 DIAGNOSIS — F411 Generalized anxiety disorder: Secondary | ICD-10-CM | POA: Insufficient documentation

## 2013-08-14 DIAGNOSIS — J45909 Unspecified asthma, uncomplicated: Secondary | ICD-10-CM | POA: Insufficient documentation

## 2013-08-14 DIAGNOSIS — G40909 Epilepsy, unspecified, not intractable, without status epilepticus: Secondary | ICD-10-CM | POA: Insufficient documentation

## 2013-08-14 DIAGNOSIS — Z9889 Other specified postprocedural states: Secondary | ICD-10-CM | POA: Insufficient documentation

## 2013-08-14 DIAGNOSIS — W230XXA Caught, crushed, jammed, or pinched between moving objects, initial encounter: Secondary | ICD-10-CM | POA: Insufficient documentation

## 2013-08-14 DIAGNOSIS — G4730 Sleep apnea, unspecified: Secondary | ICD-10-CM | POA: Insufficient documentation

## 2013-08-14 DIAGNOSIS — H5710 Ocular pain, unspecified eye: Secondary | ICD-10-CM | POA: Insufficient documentation

## 2013-08-14 DIAGNOSIS — S6390XA Sprain of unspecified part of unspecified wrist and hand, initial encounter: Secondary | ICD-10-CM | POA: Insufficient documentation

## 2013-08-14 MED ORDER — IBUPROFEN 200 MG PO TABS
600.0000 mg | ORAL_TABLET | Freq: Once | ORAL | Status: AC
  Administered 2013-08-14: 600 mg via ORAL
  Filled 2013-08-14: qty 3

## 2013-08-14 MED ORDER — ONDANSETRON 4 MG PO TBDP
4.0000 mg | ORAL_TABLET | Freq: Once | ORAL | Status: AC
  Administered 2013-08-14: 4 mg via ORAL
  Filled 2013-08-14: qty 1

## 2013-08-14 NOTE — ED Notes (Signed)
Pt upset with care today. Requesting to speak to MD. Dr. Loretha Stapler at bedside explaining to patient there is no indication for narcotic pain medication for today's visit. Pt refused to e-sign.

## 2013-08-14 NOTE — ED Notes (Signed)
Patient transported to X-ray 

## 2013-08-14 NOTE — ED Provider Notes (Signed)
CSN: [id number]     Arrival date & time 08/14/13  0702 History   First MD Initiated Contact with Patient 08/14/13 [phone]     Chief Complaint  Patient presents with  . Finger Injury  . Eye Pain  . Foot Pain   (Consider location/radiation/quality/duration/timing/severity/associated sxs/prior Treatment) Patient is a 52 y.o. female presenting with hand injury.  Hand Injury Location:  Hand Time since incident:  12 hours Injury: yes   Mechanism of injury comment:  Got finger caught in the handle of a door as it was opening.   Hand location:  L hand Pain details:    Quality:  Throbbing   Radiates to:  Does not radiate   Severity:  Moderate   Onset quality:  Sudden   Timing:  Constant   Progression:  Unchanged Chronicity:  New Handedness:  Right-handed Relieved by:  Nothing Ineffective treatments: oxycontin. Associated symptoms: no fever, no muscle weakness, no swelling and no tingling     Past Medical History  Diagnosis Date  . Nerve damage     hands  . Plantar fasciitis     bilateral  . Seizures     seizure disorder; last seizure 1 yr. ago  . Headache(784.0)     migraines  . Anxiety   . Mental disorder     anger issues  . Asthma     daily inhalers  . History of foot surgery     2 surgeries on left foot, 3 on right foot  . Carpal tunnel syndrome of left wrist 12/2012  . Sleep apnea     uses CPAP nightly   Past Surgical History  Procedure Laterality Date  . Elbow surgery Left   . Plantar fascia release    . Bunionectomy    . Hammer toe surgery    . Carpal tunnel release Left 12/26/2012    Procedure: CARPAL TUNNEL RELEASE;  Surgeon: Nicki Reaper, MD;  Location: [facility];  Service: Orthopedics;  Laterality: Left;  . Carpal tunnel release Right 01/12/2013    Procedure: Right Carpal Tunnel Release;  Surgeon: Nicki Reaper, MD;  Location: [facility];  Service: Orthopedics;  Laterality: Right;   No family history on file. History  Substance  Use Topics  . Smoking status: Current Every Day Smoker -- 0.50 packs/day for 30 years    Types: Cigarettes  . Smokeless tobacco: Never Used     Comment: 9 cig./day  . Alcohol Use: Yes     Comment: socially   OB History   Grav Para Term Preterm Abortions TAB SAB Ect Mult Living                 Review of Systems  Constitutional: Negative for fever.  All other systems reviewed and are negative.    Allergies  Review of patient's allergies indicates no known allergies.  Home Medications   Current Outpatient Rx  Name  Route  Sig  Dispense  Refill  . albuterol (PROVENTIL HFA;VENTOLIN HFA) 108 (90 BASE) MCG/ACT inhaler   Inhalation   Inhale 2 puffs into the lungs every 6 (six) hours as needed. For shortness of breath or wheeze          . budesonide-formoterol (SYMBICORT) 160-4.5 MCG/ACT inhaler   Inhalation   Inhale 2 puffs into the lungs 2 (two) times daily.         . cholecalciferol (VITAMIN D) 1000 UNITS tablet   Oral   Take 1,000 Units by mouth  daily.         . cyclobenzaprine (FLEXERIL) 10 MG tablet   Oral   Take 1 tablet (10 mg total) by mouth 2 (two) times daily as needed for muscle spasms.   10 tablet   0   . gabapentin (NEURONTIN) 100 MG capsule   Oral   Take 100 mg by mouth 3 (three) times daily.          Marland Kitchen HYDROcodone-acetaminophen (NORCO) 10-325 MG per tablet   Oral   Take 1 tablet by mouth every 8 (eight) hours as needed for pain.   30 tablet   0   . HYDROcodone-acetaminophen (NORCO) 5-325 MG per tablet   Oral   Take 1 tablet by mouth every 6 (six) hours as needed for pain.   30 tablet   0   . levonorgestrel (MIRENA) 20 MCG/24HR IUD   Intrauterine   1 each by Intrauterine route once.         . permethrin (ELIMITE) 5 % cream      Apply to affected area once   60 g   0   . phenytoin (DILANTIN) 100 MG ER capsule   Oral   Take by mouth 3 (three) times daily.         . pregabalin (LYRICA) 100 MG capsule   Oral   Take 100 mg by  mouth 3 (three) times daily.          . QUEtiapine Fumarate (SEROQUEL PO)   Oral   Take 1 tablet by mouth at bedtime.         . sertraline (ZOLOFT) 100 MG tablet   Oral   Take 100 mg by mouth 3 (three) times daily.         . sertraline (ZOLOFT) 50 MG tablet   Oral   Take 2 tablets (100 mg total) by mouth 3 (three) times daily.   30 tablet   0   . zolpidem (AMBIEN) 5 MG tablet   Oral   Take 5 mg by mouth at bedtime as needed for sleep.          BP 125/92  Pulse 103  Temp(Src) 97.9 F (36.6 C) (Oral)  Resp 20  SpO2 99% Physical Exam  Nursing note and vitals reviewed. Constitutional: She is oriented to person, place, and time. She appears well-developed and well-nourished. No distress.  HENT:  Head: Normocephalic and atraumatic.  Mouth/Throat: Oropharynx is clear and moist.  Eyes: Conjunctivae are normal. Pupils are equal, round, and reactive to light. No scleral icterus.  Neck: Neck supple.  Cardiovascular: Normal rate, regular rhythm, normal heart sounds and intact distal pulses.   No murmur heard. Pulmonary/Chest: Effort normal and breath sounds normal. No stridor. No respiratory distress. She has no rales.  Abdominal: Soft. Bowel sounds are normal. She exhibits no distension. There is no tenderness.  Musculoskeletal: Normal range of motion. She exhibits no edema.       Hands: Neurological: She is alert and oriented to person, place, and time.  Skin: Skin is warm and dry. No rash noted.  Psychiatric: She has a normal mood and affect. Her behavior is normal.    ED Course  Procedures (including critical care time) Labs Review Labs Reviewed - No data to display Imaging Review Dg Hand Complete Left  08/14/2013   CLINICAL DATA:  Hand injury. Pain at 3rd finger PIP joint.  EXAM: LEFT HAND - COMPLETE 3+ VIEW  COMPARISON:  Radiographs of the long finger 03/11/2007.  FINDINGS: The mineralization and alignment are normal. There is no evidence of acute fracture or  dislocation. Mild erosive change at the distal phalanx of the long finger is unchanged. There is no focal soft tissue swelling.  IMPRESSION: No acute osseous findings.   Electronically Signed   By: Roxy Horseman M.D.   On: 08/14/2013 08:02   Dg Foot 2 Views Left  08/12/2013   Multiple view x-rays of the left foot indicate elevation of the second toe  with distal contracture of the toe elongation of the toe. I noted on the  left third toe that there had been previous surgery done Today's radiology studies independently viewed by me.     EKG Interpretation   None       MDM   1. Finger sprain, initial encounter    52 yo female with CC of injury to left middle finger.  Finger is tender over proximal phalange and PIP joint.  Good ROM, tendon function intact, good cap refill.  She thinks it may be broken, so xray pending. Ibuprofen for pain .  Also complains of developing stye which is not apparent on exam, advised warm compresses.   Also complains of "inflammation" to feet (has chronic foot pain).  No swelling or edema.  Normal pulses.  Advised follow up with her orthopedist.    8:41 AM Plain films negative.  Will dc home.   Candyce Churn, MD 08/14/13 [phone]

## 2013-08-14 NOTE — ED Notes (Signed)
Pt. Present with multiple complaints. Reports left eye pain, states "I feel like I'm getting a sty". Reports left middle finger injury, smashed finger in door, no swelling or deformity noted, cap refill instant, denies loss of sensation, unable to bend finger. Also reports "feet inflammation". No swelling noted, tender to touch, pedal pulses intact.

## 2013-08-19 ENCOUNTER — Encounter (HOSPITAL_COMMUNITY): Payer: Self-pay | Admitting: Emergency Medicine

## 2013-08-19 ENCOUNTER — Emergency Department (INDEPENDENT_AMBULATORY_CARE_PROVIDER_SITE_OTHER)
Admission: EM | Admit: 2013-08-19 | Discharge: 2013-08-19 | Disposition: A | Discharge status: Home / Self Care | Payer: Medicare Other | Source: Home / Self Care | Attending: Emergency Medicine | Admitting: Emergency Medicine

## 2013-08-19 DIAGNOSIS — S6390XA Sprain of unspecified part of unspecified wrist and hand, initial encounter: Secondary | ICD-10-CM

## 2013-08-19 DIAGNOSIS — S63619D Unspecified sprain of unspecified finger, subsequent encounter: Secondary | ICD-10-CM

## 2013-08-19 DIAGNOSIS — J209 Acute bronchitis, unspecified: Secondary | ICD-10-CM

## 2013-08-19 DIAGNOSIS — F17200 Nicotine dependence, unspecified, uncomplicated: Secondary | ICD-10-CM

## 2013-08-19 DIAGNOSIS — X58XXXA Exposure to other specified factors, initial encounter: Secondary | ICD-10-CM

## 2013-08-19 DIAGNOSIS — J45909 Unspecified asthma, uncomplicated: Secondary | ICD-10-CM

## 2013-08-19 DIAGNOSIS — F17210 Nicotine dependence, cigarettes, uncomplicated: Secondary | ICD-10-CM

## 2013-08-19 MED ORDER — HYDROCOD POLST-CHLORPHEN POLST 10-8 MG/5ML PO LQCR: 5.0000 mL | Freq: Two times a day (BID) | ORAL | Status: DC | PRN

## 2013-08-19 MED ORDER — IPRATROPIUM BROMIDE 0.02 % IN SOLN
RESPIRATORY_TRACT | Status: AC
  Filled 2013-08-19: qty 2.5

## 2013-08-19 MED ORDER — ALBUTEROL SULFATE (5 MG/ML) 0.5% IN NEBU
5.0000 mg | INHALATION_SOLUTION | Freq: Once | RESPIRATORY_TRACT | Status: AC
  Administered 2013-08-19: 5 mg via RESPIRATORY_TRACT

## 2013-08-19 MED ORDER — PREDNISONE 20 MG PO TABS: 20.0000 mg | ORAL_TABLET | Freq: Two times a day (BID) | ORAL | Status: DC

## 2013-08-19 MED ORDER — METHYLPREDNISOLONE ACETATE 80 MG/ML IJ SUSP
INTRAMUSCULAR | Status: AC
  Filled 2013-08-19: qty 1

## 2013-08-19 MED ORDER — ALBUTEROL SULFATE (5 MG/ML) 0.5% IN NEBU
INHALATION_SOLUTION | RESPIRATORY_TRACT | Status: AC
  Filled 2013-08-19: qty 1

## 2013-08-19 MED ORDER — DOXYCYCLINE HYCLATE 100 MG PO TABS: 100.0000 mg | ORAL_TABLET | Freq: Two times a day (BID) | ORAL | Status: DC

## 2013-08-19 MED ORDER — IPRATROPIUM BROMIDE 0.02 % IN SOLN
0.5000 mg | Freq: Once | RESPIRATORY_TRACT | Status: AC
  Administered 2013-08-19: 0.5 mg via RESPIRATORY_TRACT

## 2013-08-19 MED ORDER — METHYLPREDNISOLONE ACETATE 80 MG/ML IJ SUSP
80.0000 mg | Freq: Once | INTRAMUSCULAR | Status: AC
  Administered 2013-08-19: 80 mg via INTRAMUSCULAR

## 2013-08-19 MED ORDER — TRAMADOL HCL 50 MG PO TABS: 100.0000 mg | ORAL_TABLET | Freq: Three times a day (TID) | ORAL | Status: DC | PRN

## 2013-08-19 NOTE — ED Provider Notes (Signed)
Chief Complaint:   Chief Complaint  Patient presents with  . Hand Pain  . URI    History of Present Illness:   Alyssa Schwartz is a 52 year old female who presents with URI symptoms and left middle finger injury.  1. URI symptoms: The patient has a two-day history of nasal congestion with bloody, green drainage, sneezing, sinus pressure, and headache. She's also had a dry cough, chills, and nausea. She denies any fever, sore throat, wheezing, or vomiting. She's had no known sick exposures.  2. Finger injury: She injured her left middle finger 2 weeks ago. It's been sore and somewhat swollen over the proximal phalanx. She went to the emergency room on October 24 had an x-ray. She was told it was sprain. She was given some clear tape, but not split.  Review of Systems:  Other than noted above, the patient denies any of the following symptoms: Systemic:  No fevers, chills, sweats, weight loss or gain, fatigue, or tiredness. Eye:  No redness or discharge. ENT:  No ear pain, drainage, headache, nasal congestion, drainage, sinus pressure, difficulty swallowing, or sore throat. Neck:  No neck pain or swollen glands. Lungs:  No cough, sputum production, hemoptysis, wheezing, chest tightness, shortness of breath or chest pain. GI:  No abdominal pain, nausea, vomiting or diarrhea.  PMFSH:  Past medical history, family history, social history, meds, and allergies were reviewed. She has no known medication allergies. She takes multiple medications including albuterol, Symbicort, Dilantin, Lyrica, Zoloft, and Ambien. She has a history of nerve damage, but her fasciitis, seizures, headaches, anxiety, anger issues, asthma, multiple foot surgeries, carpal tunnel syndrome, and sleep apnea. She currently smokes a half pack of cigarettes per day.  Physical Exam:   Vital signs:  BP 119/91  Pulse 88  Temp(Src) 98.3 F (36.8 C) (Oral)  Resp 20  SpO2 96% General:  Alert and oriented.  In no distress.  Skin warm  and dry. Eye:  No conjunctival injection or drainage. Lids were normal. ENT:  TMs and canals were normal, without erythema or inflammation.  Nasal mucosa was clear and uncongested, without drainage.  Mucous membranes were moist.  Pharynx was clear with no exudate or drainage.  There were no oral ulcerations or lesions. Neck:  Supple, no adenopathy, tenderness or mass. Lungs:  No respiratory distress.  Lungs were clear to auscultation, without wheezes, rales or rhonchi.  Breath sounds were clear and equal bilaterally.  Heart:  Regular rhythm, without gallops, murmers or rubs. Extremities: There is pain to palpation over the proximal phalanx of her left middle finger. This has a limited range of motion with pain both actively and passively. Skin:  Clear, warm, and dry, without rash or lesions.  Course in Urgent Care Center:   The left middle finger was splinted in a position of function.  Assessment:  The primary encounter diagnosis was Acute bronchitis. Diagnoses of Asthma, Cigarette smoker, and Finger sprain, subsequent encounter were also pertinent to this visit.  Plan:   1.  Meds:  The following meds were prescribed:   Discharge Medication List as of 08/19/2013 11:19 AM    START taking these medications   Details  chlorpheniramine-HYDROcodone (TUSSIONEX) 10-8 MG/5ML LQCR Take 5 mLs by mouth every 12 (twelve) hours as needed., Starting 08/19/2013, Until Discontinued, Normal    doxycycline (VIBRA-TABS) 100 MG tablet Take 1 tablet (100 mg total) by mouth 2 (two) times daily., Starting 08/19/2013, Until Discontinued, Print    predniSONE (DELTASONE) 20 MG tablet Take  1 tablet (20 mg total) by mouth 2 (two) times daily., Starting 08/19/2013, Until Discontinued, Print    traMADol (ULTRAM) 50 MG tablet Take 2 tablets (100 mg total) by mouth every 8 (eight) hours as needed for pain., Starting 08/19/2013, Until Discontinued, Normal        2.  Patient Education/Counseling:  The patient was given  appropriate handouts, self care instructions, and instructed in symptomatic relief.  She was strongly encouraged to quit smoking.  3.  Follow up:  The patient was told to follow up if no better in 3 to 4 days, if becoming worse in any way, and given some red flag symptoms such as fever, difficulty breathing, or worsening pain which would prompt immediate return.  Follow up here as needed.      Reuben Likes, MD 08/19/13 2223

## 2013-08-19 NOTE — ED Notes (Signed)
Discharge pending completion of breathing treament

## 2013-08-19 NOTE — ED Notes (Signed)
Left middle finger pain for 2 weeks.  Reports bending finger backwards.  Seen at [facility]  Treated and released.   Patient also complains of uri: sneezing, coughing, hot spells, no sore throat, no cough, no ear pain.

## 2013-08-25 ENCOUNTER — Encounter: Payer: Self-pay | Admitting: Podiatry

## 2013-08-25 DIAGNOSIS — M2040 Other hammer toe(s) (acquired), unspecified foot: Secondary | ICD-10-CM

## 2013-08-31 ENCOUNTER — Ambulatory Visit (INDEPENDENT_AMBULATORY_CARE_PROVIDER_SITE_OTHER): Payer: Medicare Other | Admitting: Podiatry

## 2013-08-31 ENCOUNTER — Ambulatory Visit (INDEPENDENT_AMBULATORY_CARE_PROVIDER_SITE_OTHER): Payer: Medicare Other

## 2013-08-31 ENCOUNTER — Encounter: Payer: Self-pay | Admitting: Podiatry

## 2013-08-31 VITALS — BP 120/77 | HR 94 | Resp 18

## 2013-08-31 DIAGNOSIS — Z9889 Other specified postprocedural states: Secondary | ICD-10-CM

## 2013-08-31 DIAGNOSIS — M2040 Other hammer toe(s) (acquired), unspecified foot: Secondary | ICD-10-CM

## 2013-08-31 MED ORDER — OXYCODONE-ACETAMINOPHEN 10-325 MG PO TABS: 1.0000 | ORAL_TABLET | Freq: Three times a day (TID) | ORAL | Status: DC | PRN

## 2013-08-31 NOTE — Progress Notes (Signed)
°  Subjective:    Patient ID: Alyssa Schwartz, female    DOB: [birth year], 52 y.o.   MRN: [id number]  HPIhad surgery on 08/25/2013 on my 2nd toe left foot and still hurts some    Review of Systems     Objective:   Physical Exam        Assessment & Plan:

## 2013-08-31 NOTE — Progress Notes (Signed)
Subjective:     Patient ID: Alyssa Schwartz, female   DOB: 23-Apr-1961, 52 y.o.   MRN: [id number]  HPI patient states my toe feels okay. I'm still having a lot of pain but overall walking well 1 week after surgery left   Review of Systems  All other systems reviewed and are negative.       Objective:   Physical Exam  Nursing note and vitals reviewed. Constitutional: She is oriented to person, place, and time.  Cardiovascular: Intact distal pulses.   Musculoskeletal: Normal range of motion.  Neurological: She is oriented to person, place, and time.   clinically patient second toe looks good with pain intact and overall alignment of the toe good     Assessment:     Healing second toe left with stitches intact wound edges well coapted and position good clinically    Plan:     X-ray reviewed and advised that there is some elevation of the distal part of the toe but that was the part I did not work on and should not be symptomatic for her

## 2013-09-14 ENCOUNTER — Encounter: Payer: Self-pay | Admitting: Podiatry

## 2013-09-14 ENCOUNTER — Ambulatory Visit (INDEPENDENT_AMBULATORY_CARE_PROVIDER_SITE_OTHER): Payer: Medicare Other | Admitting: Podiatry

## 2013-09-14 VITALS — BP 136/78 | HR 79 | Resp 12

## 2013-09-14 DIAGNOSIS — M2040 Other hammer toe(s) (acquired), unspecified foot: Secondary | ICD-10-CM

## 2013-09-14 NOTE — Progress Notes (Signed)
Subjective:     Patient ID: Alyssa Schwartz, female   DOB: [birth year], 52 y.o.   MRN: [id number]  HPI patient states my toe is doing well. Points to second toe left satisfied with position   Review of Systems     Objective:   Physical Exam Neurovascular status intact. Pin intact distal second toe left with good alignment of the toe noted    Assessment:     Good structure second toe with good alignment noted and stitches intact with wound edges coapted well    Plan:     Removal of stitches and dispensed digital splint to lower the toe with instructions on usage. Reappoint 2 weeks pin removal

## 2013-09-28 ENCOUNTER — Ambulatory Visit (INDEPENDENT_AMBULATORY_CARE_PROVIDER_SITE_OTHER): Payer: Medicare Other

## 2013-09-28 ENCOUNTER — Encounter: Payer: Self-pay | Admitting: Podiatry

## 2013-09-28 ENCOUNTER — Ambulatory Visit (INDEPENDENT_AMBULATORY_CARE_PROVIDER_SITE_OTHER): Payer: Medicare Other | Admitting: Podiatry

## 2013-09-28 VITALS — BP 112/82 | HR 95 | Resp 18

## 2013-09-28 DIAGNOSIS — Z9889 Other specified postprocedural states: Secondary | ICD-10-CM

## 2013-09-28 DIAGNOSIS — M2040 Other hammer toe(s) (acquired), unspecified foot: Secondary | ICD-10-CM

## 2013-09-28 NOTE — Progress Notes (Signed)
Subjective:     Patient ID: Alyssa Schwartz, female   DOB: 29-Jul-1961, 53 y.o.   MRN: [id number]  HPI patient states my toe is doing great ready to have this pin removed   Review of Systems     Objective:   Physical Exam Neurovascular status intact with pain in place the second toe left foot    Assessment:     5 weeks postop doing well digital fusion    Plan:     Pin removed sterile dressing applied and x-ray taken and reviewed. Patient is very happy with the results and I advised on continued plantar flexion of the toe but she no longer is getting the symptoms she use to experience. Begin wearing shoes and reappoint in the next 6 weeks

## 2013-09-28 NOTE — Progress Notes (Signed)
° °  Subjective:    Patient ID: Alyssa Schwartz, female    DOB: 16-Sep-1961, 52 y.o.   MRN: [id number]  HPI left foot is doing ok and surgery was on 08/25/13    Review of Systems     Objective:   Physical Exam        Assessment & Plan:

## 2013-10-27 NOTE — Progress Notes (Signed)
1) Hammer toe repair fusion with pin 2nd toe left foot

## 2013-11-02 NOTE — Progress Notes (Signed)
1) Hammer toe repair 2nd toe left foot

## 2013-11-04 ENCOUNTER — Encounter (HOSPITAL_COMMUNITY): Payer: Self-pay | Admitting: Emergency Medicine

## 2013-11-04 ENCOUNTER — Emergency Department (HOSPITAL_COMMUNITY)
Admission: EM | Admit: 2013-11-04 | Discharge: 2013-11-04 | Disposition: A | Discharge status: Home / Self Care | Payer: Medicare Other | Attending: Emergency Medicine | Admitting: Emergency Medicine

## 2013-11-04 ENCOUNTER — Emergency Department (HOSPITAL_COMMUNITY): Payer: Medicare Other

## 2013-11-04 DIAGNOSIS — Z8669 Personal history of other diseases of the nervous system and sense organs: Secondary | ICD-10-CM | POA: Insufficient documentation

## 2013-11-04 DIAGNOSIS — IMO0002 Reserved for concepts with insufficient information to code with codable children: Secondary | ICD-10-CM | POA: Insufficient documentation

## 2013-11-04 DIAGNOSIS — J45909 Unspecified asthma, uncomplicated: Secondary | ICD-10-CM | POA: Insufficient documentation

## 2013-11-04 DIAGNOSIS — Z792 Long term (current) use of antibiotics: Secondary | ICD-10-CM | POA: Insufficient documentation

## 2013-11-04 DIAGNOSIS — G4730 Sleep apnea, unspecified: Secondary | ICD-10-CM | POA: Insufficient documentation

## 2013-11-04 DIAGNOSIS — Z79899 Other long term (current) drug therapy: Secondary | ICD-10-CM | POA: Insufficient documentation

## 2013-11-04 DIAGNOSIS — R109 Unspecified abdominal pain: Secondary | ICD-10-CM

## 2013-11-04 DIAGNOSIS — Z8719 Personal history of other diseases of the digestive system: Secondary | ICD-10-CM | POA: Insufficient documentation

## 2013-11-04 DIAGNOSIS — F17200 Nicotine dependence, unspecified, uncomplicated: Secondary | ICD-10-CM | POA: Insufficient documentation

## 2013-11-04 DIAGNOSIS — R1013 Epigastric pain: Secondary | ICD-10-CM | POA: Insufficient documentation

## 2013-11-04 DIAGNOSIS — R079 Chest pain, unspecified: Secondary | ICD-10-CM | POA: Insufficient documentation

## 2013-11-04 DIAGNOSIS — Z9889 Other specified postprocedural states: Secondary | ICD-10-CM | POA: Insufficient documentation

## 2013-11-04 DIAGNOSIS — F411 Generalized anxiety disorder: Secondary | ICD-10-CM | POA: Insufficient documentation

## 2013-11-04 DIAGNOSIS — Z8679 Personal history of other diseases of the circulatory system: Secondary | ICD-10-CM | POA: Insufficient documentation

## 2013-11-04 DIAGNOSIS — Z8739 Personal history of other diseases of the musculoskeletal system and connective tissue: Secondary | ICD-10-CM | POA: Insufficient documentation

## 2013-11-04 LAB — URINALYSIS, ROUTINE W REFLEX MICROSCOPIC
BILIRUBIN URINE: NEGATIVE
Glucose, UA: NEGATIVE mg/dL
Ketones, ur: NEGATIVE mg/dL
Leukocytes, UA: NEGATIVE
NITRITE: NEGATIVE
PH: 6 (ref 5.0–8.0)
Protein, ur: NEGATIVE mg/dL
Specific Gravity, Urine: 1.01 (ref 1.005–1.030)
Urobilinogen, UA: 0.2 mg/dL (ref 0.0–1.0)

## 2013-11-04 LAB — URINE MICROSCOPIC-ADD ON

## 2013-11-04 LAB — COMPREHENSIVE METABOLIC PANEL
ALK PHOS: 61 U/L (ref 39–117)
ALT: 10 U/L (ref 0–35)
AST: 10 U/L (ref 0–37)
Albumin: 2.9 g/dL — ABNORMAL LOW (ref 3.5–5.2)
BUN: 14 mg/dL (ref 6–23)
CO2: 20 meq/L (ref 19–32)
Calcium: 9 mg/dL (ref 8.4–10.5)
Chloride: 104 mEq/L (ref 96–112)
Creatinine, Ser: 0.8 mg/dL (ref 0.50–1.10)
GFR calc non Af Amer: 84 mL/min — ABNORMAL LOW (ref >90)
GLUCOSE: 131 mg/dL — AB (ref 70–99)
POTASSIUM: 4.2 meq/L (ref 3.7–5.3)
SODIUM: 139 meq/L (ref 137–147)
Total Bilirubin: <0.2 mg/dL — ABNORMAL LOW (ref 0.3–1.2)
Total Protein: 6.5 g/dL (ref 6.0–8.3)

## 2013-11-04 LAB — CBC
HCT: 38.2 % (ref 36.0–46.0)
HEMOGLOBIN: 12.6 g/dL (ref 12.0–15.0)
MCH: 30.7 pg (ref 26.0–34.0)
MCHC: 33 g/dL (ref 30.0–36.0)
MCV: 93.2 fL (ref 78.0–100.0)
Platelets: 318 10*3/uL (ref 150–400)
RBC: 4.1 MIL/uL (ref 3.87–5.11)
RDW: 14.8 % (ref 11.5–15.5)
WBC: 10.8 10*3/uL — ABNORMAL HIGH (ref 4.0–10.5)

## 2013-11-04 LAB — POCT I-STAT TROPONIN I: Troponin i, poc: 0 ng/mL (ref 0.00–0.08)

## 2013-11-04 LAB — LIPASE, BLOOD: LIPASE: 34 U/L (ref 11–59)

## 2013-11-04 LAB — PRO B NATRIURETIC PEPTIDE: PRO B NATRI PEPTIDE: 30.9 pg/mL (ref 0–125)

## 2013-11-04 MED ORDER — SUCRALFATE 1 G PO TABS: 1.0000 g | ORAL_TABLET | Freq: Four times a day (QID) | ORAL | Status: DC

## 2013-11-04 NOTE — Discharge Instructions (Signed)
Abdominal Pain, Adult °Many things can cause abdominal pain. Usually, abdominal pain is not caused by a disease and will improve without treatment. It can often be observed and treated at home. Your health care provider will do a physical exam and possibly order blood tests and X-rays to help determine the seriousness of your pain. However, in many cases, more time must pass before a clear cause of the pain can be found. Before that point, your health care provider may not know if you need more testing or further treatment. °HOME CARE INSTRUCTIONS  °Monitor your abdominal pain for any changes. The following actions may help to alleviate any discomfort you are experiencing: °· Only take over-the-counter or prescription medicines as directed by your health care provider. °· Do not take laxatives unless directed to do so by your health care provider. °· Try a clear liquid diet (broth, tea, or water) as directed by your health care provider. Slowly move to a bland diet as tolerated. °SEEK MEDICAL CARE IF: °· You have unexplained abdominal pain. °· You have abdominal pain associated with nausea or diarrhea. °· You have pain when you urinate or have a bowel movement. °· You experience abdominal pain that wakes you in the night. °· You have abdominal pain that is worsened or improved by eating food. °· You have abdominal pain that is worsened with eating fatty foods. °SEEK IMMEDIATE MEDICAL CARE IF:  °· Your pain does not go away within 2 hours. °· You have a fever. °· You keep throwing up (vomiting). °· Your pain is felt only in portions of the abdomen, such as the right side or the left lower portion of the abdomen. °· You pass bloody or black tarry stools. °MAKE SURE YOU: °· Understand these instructions.   °· Will watch your condition.   °· Will get help right away if you are not doing well or get worse.   °Document Released: 07/18/2005 Document Revised: 07/29/2013 Document Reviewed: 06/17/2013 °[employer]® Patient  Information ©2014 [employer]. ° °

## 2013-11-04 NOTE — ED Notes (Signed)
Patient had left the waiting room to smoke and go park her car.  10 minute protocol not made.

## 2013-11-04 NOTE — ED Notes (Addendum)
Pt has multiple c/o upper abd pain, pt states she had hernia removed about a year ago and has been having problems since. Pt states she had some blood in her stools she noticed this morning. Pt also reports rt ear pain, and increased sob. Pt states she has asthma and gave herself a neb tx 3 days ago. Pt also reports rt sided cp that does not radiate. Pt describes pain as sharp in nature, and states she gets sob with it. Pt rates pain 8/10.

## 2013-11-04 NOTE — ED Provider Notes (Signed)
CSN: [id number]     Arrival date & time 11/04/13  1056 History   First MD Initiated Contact with Patient 11/04/13 1219     Chief Complaint  Patient presents with  . Abdominal Pain  . Chest Pain   (Consider location/radiation/quality/duration/timing/severity/associated sxs/prior Treatment) Patient is a 53 y.o. female presenting with abdominal pain and chest pain. The history is provided by the patient.  Abdominal Pain Associated symptoms: chest pain   Chest Pain Associated symptoms: abdominal pain    patient here complaining of 2 month history of upper abdominal pain is worse after she eats. Has a prior history of hiatal hernia that was repaired surgically. Notes worsening symptoms x4 days without vomiting or diarrhea. Pain starts in her upper abdomen and radiates to her chest. No anginal type symptoms. Denies any fever or cough. Was seen at the [facility] 3 days ago and diagnosed with right otitis media. Patient presents now due to frustration in not be limited to have her issue addressed at the [facility]. No new treatments used for her symptoms prior to arrival. Denies any black or bloody stools.  Past Medical History  Diagnosis Date  . Nerve damage     hands  . Plantar fasciitis     bilateral  . Seizures     seizure disorder; last seizure 1 yr. ago  . Headache(784.0)     migraines  . Anxiety   . Mental disorder     anger issues  . Asthma     daily inhalers  . History of foot surgery     2 surgeries on left foot, 3 on right foot  . Carpal tunnel syndrome of left wrist 12/2012  . Sleep apnea     uses CPAP nightly   Past Surgical History  Procedure Laterality Date  . Elbow surgery Left   . Plantar fascia release    . Bunionectomy    . Hammer toe surgery    . Carpal tunnel release Left 12/26/2012    Procedure: CARPAL TUNNEL RELEASE;  Surgeon: Nicki ReaperGary R Kuzma, MD;  Location: [facility];  Service: Orthopedics;  Laterality: Left;  . Carpal tunnel release Right 01/12/2013   Procedure: Right Carpal Tunnel Release;  Surgeon: Nicki ReaperGary R Kuzma, MD;  Location: [facility];  Service: Orthopedics;  Laterality: Right;   No family history on file. History  Substance Use Topics  . Smoking status: Current Every Day Smoker -- 0.50 packs/day for 30 years    Types: Cigarettes  . Smokeless tobacco: Never Used     Comment: 9 cig./day  . Alcohol Use: Yes     Comment: socially   OB History   Grav Para Term Preterm Abortions TAB SAB Ect Mult Living                 Review of Systems  Cardiovascular: Positive for chest pain.  Gastrointestinal: Positive for abdominal pain.  All other systems reviewed and are negative.    Allergies  Review of patient's allergies indicates no known allergies.  Home Medications   Current Outpatient Rx  Name  Route  Sig  Dispense  Refill  . albuterol (PROVENTIL HFA;VENTOLIN HFA) 108 (90 BASE) MCG/ACT inhaler   Inhalation   Inhale 2 puffs into the lungs every 6 (six) hours as needed. For shortness of breath or wheeze          . amoxicillin (AMOXIL) 500 MG capsule   Oral   Take 500 mg by mouth 3 (three) times  daily. Starting on 11/02/13  For 10 days         . budesonide-formoterol (SYMBICORT) 160-4.5 MCG/ACT inhaler   Inhalation   Inhale 2 puffs into the lungs 2 (two) times daily.         . cholecalciferol (VITAMIN D) 1000 UNITS tablet   Oral   Take 1,000 Units by mouth daily.         Marland Kitchen HYDROcodone-acetaminophen (NORCO) 10-325 MG per tablet   Oral   Take 1 tablet by mouth every 8 (eight) hours as needed for pain.   30 tablet   0   . ibuprofen (ADVIL,MOTRIN) 600 MG tablet   Oral   Take 600 mg by mouth every 8 (eight) hours as needed for moderate pain.         Marland Kitchen levonorgestrel (MIRENA) 20 MCG/24HR IUD   Intrauterine   1 each by Intrauterine route once.         . predniSONE (STERAPRED UNI-PAK) 10 MG tablet   Oral   Take by mouth See admin instructions. Taper dose pack started on 11/02/13         .  Pseudoephedrine HCl (SUDAFED PO)   Oral   Take 1 tablet by mouth daily as needed (for cold).         . QUEtiapine (SEROQUEL) 25 MG tablet   Oral   Take 25 mg by mouth at bedtime.         Marland Kitchen zolpidem (AMBIEN) 5 MG tablet   Oral   Take 5 mg by mouth at bedtime as needed for sleep.          BP 111/78  Pulse 101  Temp(Src) 98.1 F (36.7 C) (Oral)  Resp 20  Ht 5\' 11"  (1.803 m)  Wt 218 lb (98.884 kg)  BMI 30.42 kg/m2  SpO2 100% Physical Exam  Nursing note and vitals reviewed. Constitutional: She is oriented to person, place, and time. She appears well-developed and well-nourished.  Non-toxic appearance. No distress.  HENT:  Head: Normocephalic and atraumatic.  Eyes: Conjunctivae, EOM and lids are normal. Pupils are equal, round, and reactive to light.  Neck: Normal range of motion. Neck supple. No tracheal deviation present. No mass present.  Cardiovascular: Normal rate, regular rhythm and normal heart sounds.  Exam reveals no gallop.   No murmur heard. Pulmonary/Chest: Effort normal and breath sounds normal. No stridor. No respiratory distress. She has no decreased breath sounds. She has no wheezes. She has no rhonchi. She has no rales.  Abdominal: Soft. Normal appearance and bowel sounds are normal. She exhibits no distension. There is tenderness in the epigastric area. There is no rigidity, no rebound, no guarding and no CVA tenderness.    Musculoskeletal: Normal range of motion. She exhibits no edema and no tenderness.  Neurological: She is alert and oriented to person, place, and time. She has normal strength. No cranial nerve deficit or sensory deficit. GCS eye subscore is 4. GCS verbal subscore is 5. GCS motor subscore is 6.  Skin: Skin is warm and dry. No abrasion and no rash noted.  Psychiatric: She has a normal mood and affect. Her speech is normal and behavior is normal.    ED Course  Procedures (including critical care time) Labs Review Labs Reviewed  CBC -  Abnormal; Notable for the following:    WBC 10.8 (*)    All other components within normal limits  URINALYSIS, ROUTINE W REFLEX MICROSCOPIC - Abnormal; Notable for the following:  Hgb urine dipstick TRACE (*)    All other components within normal limits  PRO B NATRIURETIC PEPTIDE  URINE MICROSCOPIC-ADD ON  COMPREHENSIVE METABOLIC PANEL  LIPASE, BLOOD  POCT I-STAT TROPONIN I   Imaging Review No results found.  EKG Interpretation    Date/Time:  Wednesday November 04 2013 11:11:16 EST Ventricular Rate:  103 PR Interval:  124 QRS Duration: 74 QT Interval:  338 QTC Calculation: 442 R Axis:   -11 Text Interpretation:  Sinus tachycardia with occasional Premature ventricular complexes Minimal voltage criteria for LVH, may be normal variant Borderline ECG Confirmed by Seirra Kos  MD, Nandi Tonnesen ([id number]) on 11/04/2013 12:19:41 PM            MDM  No diagnosis found. Patient's labs and x-rays reviewed and no concern for ACS at this time. Suspect that she has had a hernia similar to her prior symptoms. Will place on Carafate she is already on PPI. She will followup at the [facility]    Toy Baker, MD 11/04/13 1444

## 2013-11-04 NOTE — ED Notes (Signed)
Dr Allen at bedside  

## 2013-11-04 NOTE — ED Notes (Signed)
Pt reports centralized sharp chest pain since yesterday. Also reports upper abdominal pain from where hernia was previously removed. Shortness of breath and nausea.

## 2013-11-04 NOTE — ED Notes (Signed)
Discharge instructions reviewed with pt. Pt verbalized understanding.   

## 2013-11-11 ENCOUNTER — Ambulatory Visit (INDEPENDENT_AMBULATORY_CARE_PROVIDER_SITE_OTHER): Payer: Medicare Other

## 2013-11-11 ENCOUNTER — Encounter: Payer: Self-pay | Admitting: Podiatry

## 2013-11-11 ENCOUNTER — Ambulatory Visit (INDEPENDENT_AMBULATORY_CARE_PROVIDER_SITE_OTHER): Payer: Medicare Other | Admitting: Podiatry

## 2013-11-11 VITALS — BP 127/75 | HR 87 | Resp 16

## 2013-11-11 DIAGNOSIS — M779 Enthesopathy, unspecified: Secondary | ICD-10-CM

## 2013-11-11 DIAGNOSIS — Z9889 Other specified postprocedural states: Secondary | ICD-10-CM

## 2013-11-11 DIAGNOSIS — M2040 Other hammer toe(s) (acquired), unspecified foot: Secondary | ICD-10-CM

## 2013-11-11 MED ORDER — TRIAMCINOLONE ACETONIDE 10 MG/ML IJ SUSP
10.0000 mg | Freq: Once | INTRAMUSCULAR | Status: AC
  Administered 2013-11-11: 10 mg

## 2013-11-11 NOTE — Progress Notes (Signed)
Subjective:     Patient ID: Alyssa ParsleyAva D Schwartz, female   DOB: 11/05/1960, 53 y.o.   MRN: [id number]  HPI patient states that my toe is doing well but it hurts more in the joint of the foot second left. States it's been bothering her for the last 34 weeks   Review of Systems     Objective:   Physical Exam Neurovascular status unchanged with inflammation around the metatarsophalangeal joint second left with a well structured second toe and good incisional site healed    Assessment:     Probable capsulitis second MPJ left with well structured second toe left foot    Plan:     H&P and x-ray reviewed today I injected the left second MPJ to milligrams Kenalog dexamethasone combination 5 mg Xylocaine and advised we may need to do a small bone spur resection if symptoms persist reappoint as needed

## 2014-11-06 IMAGING — CR DG CHEST 2V
2 series · 2 of 2 positions shown · non-contrast
Comparison: Chest x-ray 10/02/2012.

CLINICAL DATA: Cough and shortness of breath.

CHEST - 2 VIEW

[w chest pa]
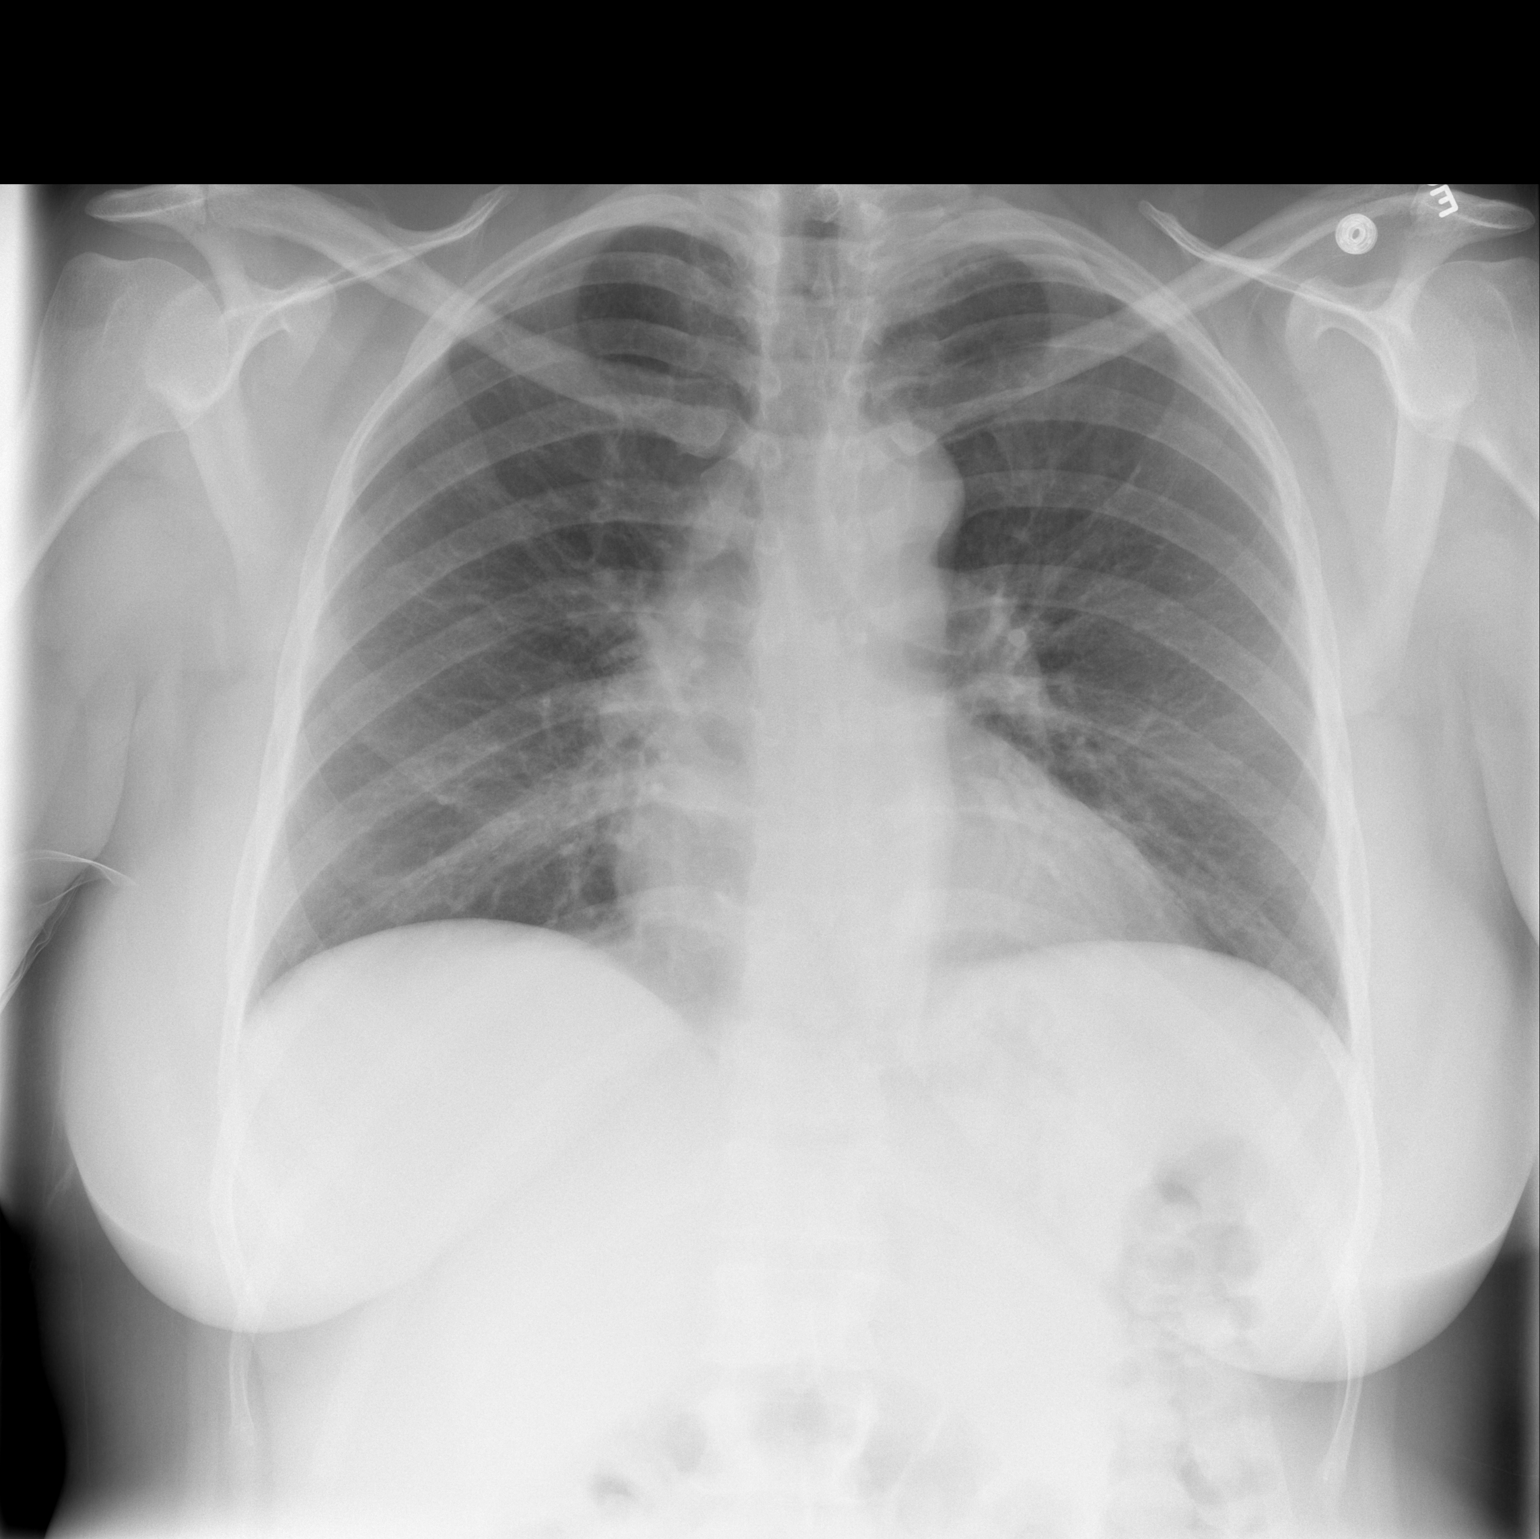

[w chest lat]
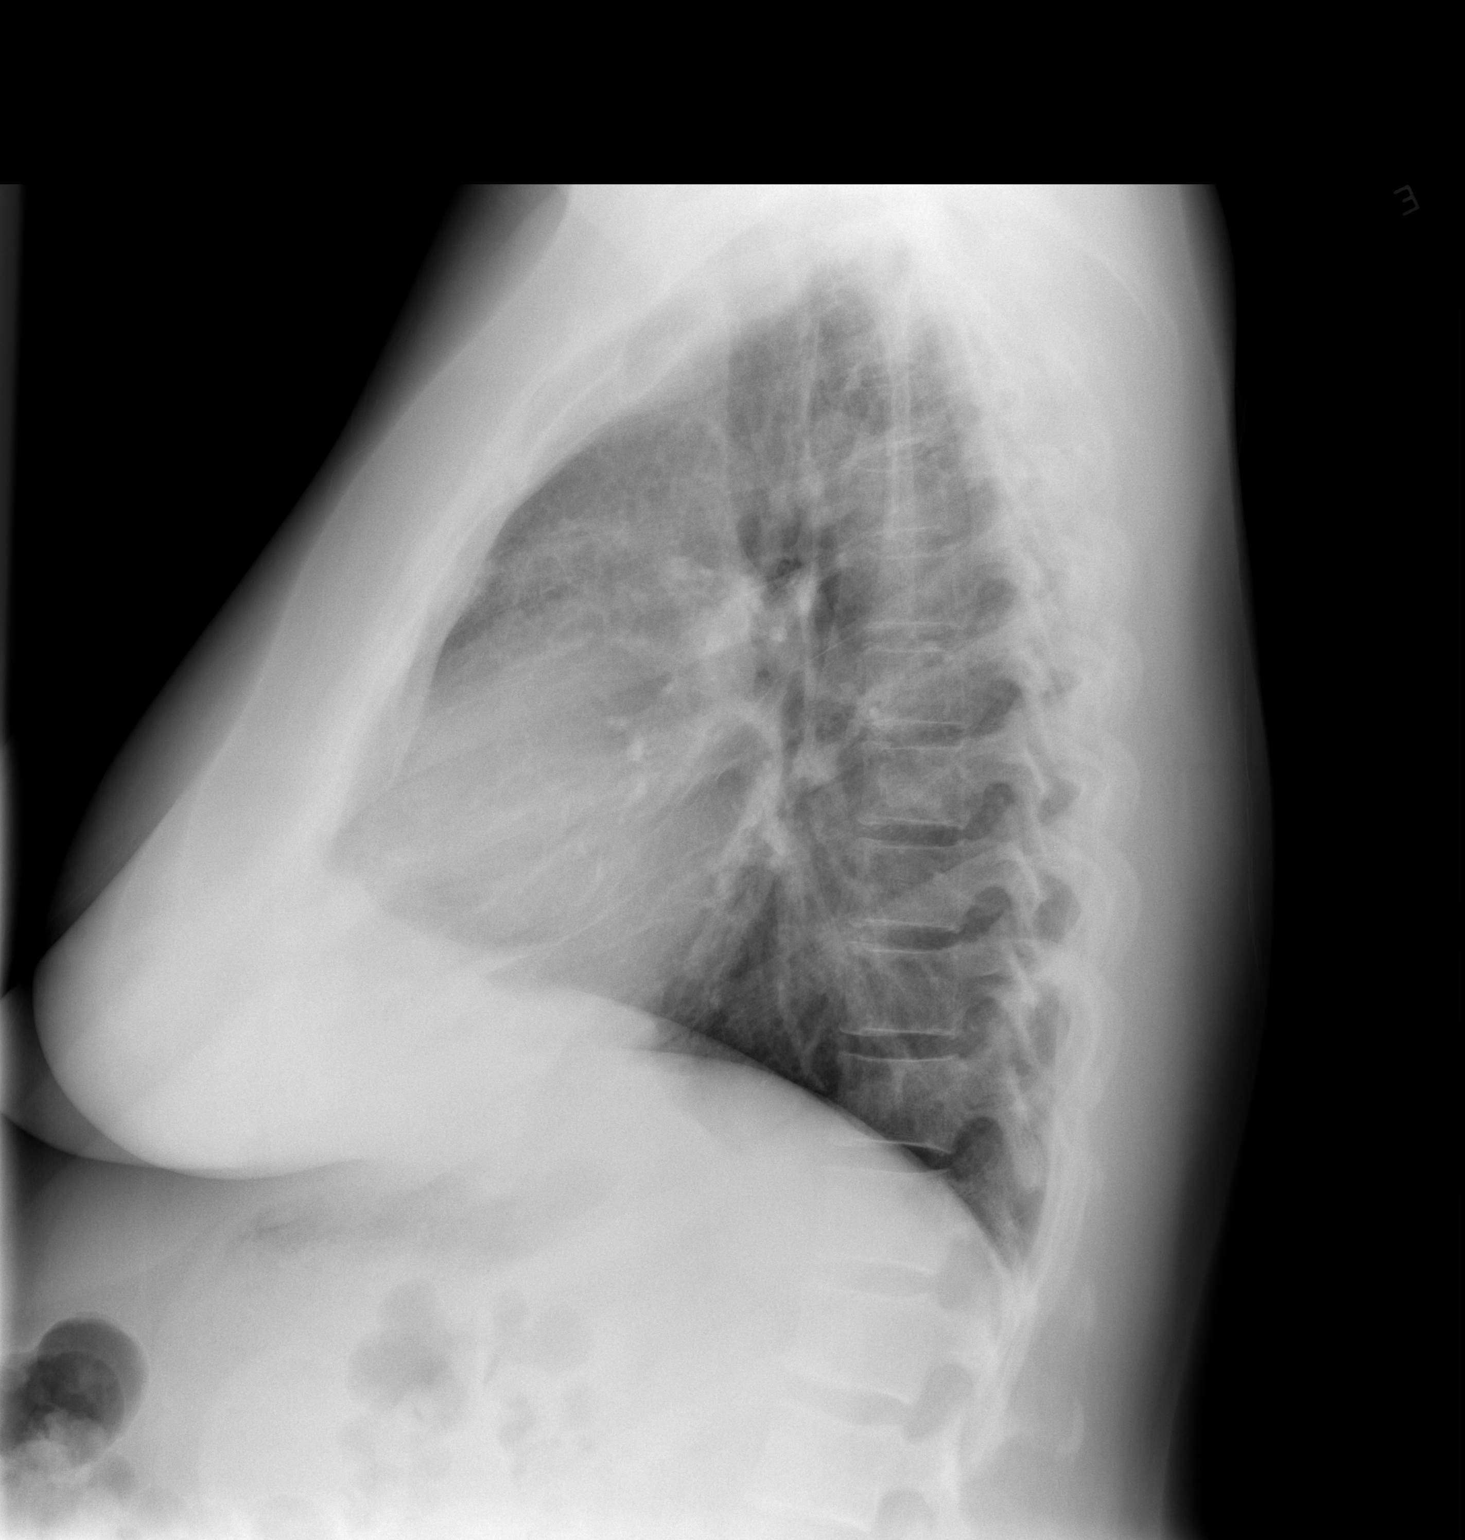

[2 of 2 positions shown; findings below may reference images not displayed]

FINDINGS: There is an ill-defined area of interstitial prominence
projecting over the anterior aspect of the upper chest on the
lateral projection which is difficult to localize on the frontal
view, but it appears to correspond with some subtle interstitial
prominence in the region of the right upper lobe.  Lungs are
otherwise clear.  No pleural effusions.  No evidence of pulmonary
edema.  Heart size is within normal limits.  Mediastinal contours
are unremarkable.
IMPRESSION: 1.  Subtle area of interstitial prominence in the anterior aspect
of the right upper lobe suspicious for early changes of
bronchopneumonia.  Clinical correlation is recommended, and repeat
radiographs in 2-3 weeks after trial of antimicrobial therapy are
recommended to ensure resolution of these findings.

## 2014-12-13 ENCOUNTER — Emergency Department (HOSPITAL_COMMUNITY)
Admission: EM | Admit: 2014-12-13 | Discharge: 2014-12-13 | Disposition: A | Discharge status: Home / Self Care | Payer: Commercial Managed Care - HMO | Attending: Emergency Medicine | Admitting: Emergency Medicine

## 2014-12-13 ENCOUNTER — Encounter (HOSPITAL_COMMUNITY): Payer: Self-pay | Admitting: *Deleted

## 2014-12-13 ENCOUNTER — Emergency Department (HOSPITAL_COMMUNITY): Payer: Commercial Managed Care - HMO

## 2014-12-13 DIAGNOSIS — J209 Acute bronchitis, unspecified: Secondary | ICD-10-CM | POA: Diagnosis not present

## 2014-12-13 DIAGNOSIS — Z720 Tobacco use: Secondary | ICD-10-CM | POA: Diagnosis not present

## 2014-12-13 DIAGNOSIS — Z7951 Long term (current) use of inhaled steroids: Secondary | ICD-10-CM | POA: Insufficient documentation

## 2014-12-13 DIAGNOSIS — R079 Chest pain, unspecified: Secondary | ICD-10-CM | POA: Diagnosis present

## 2014-12-13 DIAGNOSIS — F419 Anxiety disorder, unspecified: Secondary | ICD-10-CM | POA: Diagnosis not present

## 2014-12-13 DIAGNOSIS — Z8669 Personal history of other diseases of the nervous system and sense organs: Secondary | ICD-10-CM | POA: Insufficient documentation

## 2014-12-13 DIAGNOSIS — Z8739 Personal history of other diseases of the musculoskeletal system and connective tissue: Secondary | ICD-10-CM | POA: Diagnosis not present

## 2014-12-13 DIAGNOSIS — Z9981 Dependence on supplemental oxygen: Secondary | ICD-10-CM | POA: Diagnosis not present

## 2014-12-13 DIAGNOSIS — R509 Fever, unspecified: Secondary | ICD-10-CM | POA: Diagnosis not present

## 2014-12-13 DIAGNOSIS — J40 Bronchitis, not specified as acute or chronic: Secondary | ICD-10-CM | POA: Diagnosis not present

## 2014-12-13 DIAGNOSIS — R05 Cough: Secondary | ICD-10-CM | POA: Diagnosis not present

## 2014-12-13 DIAGNOSIS — Z79899 Other long term (current) drug therapy: Secondary | ICD-10-CM | POA: Diagnosis not present

## 2014-12-13 DIAGNOSIS — F17210 Nicotine dependence, cigarettes, uncomplicated: Secondary | ICD-10-CM | POA: Diagnosis not present

## 2014-12-13 DIAGNOSIS — R0789 Other chest pain: Secondary | ICD-10-CM | POA: Diagnosis not present

## 2014-12-13 DIAGNOSIS — R0989 Other specified symptoms and signs involving the circulatory and respiratory systems: Secondary | ICD-10-CM | POA: Diagnosis not present

## 2014-12-13 LAB — BASIC METABOLIC PANEL
Anion gap: 8 (ref 5–15)
BUN: 12 mg/dL (ref 6–23)
CALCIUM: 8.8 mg/dL (ref 8.4–10.5)
CHLORIDE: 107 mmol/L (ref 96–112)
CO2: 23 mmol/L (ref 19–32)
Creatinine, Ser: 0.83 mg/dL (ref 0.50–1.10)
GFR calc Af Amer: >90 mL/min (ref >90)
GFR calc non Af Amer: 80 mL/min — ABNORMAL LOW (ref >90)
GLUCOSE: 96 mg/dL (ref 70–99)
POTASSIUM: 4.4 mmol/L (ref 3.5–5.1)
SODIUM: 138 mmol/L (ref 135–145)

## 2014-12-13 LAB — CBC
HCT: 38.2 % (ref 36.0–46.0)
Hemoglobin: 12.6 g/dL (ref 12.0–15.0)
MCH: 29.3 pg (ref 26.0–34.0)
MCHC: 33 g/dL (ref 30.0–36.0)
MCV: 88.8 fL (ref 78.0–100.0)
Platelets: 350 10*3/uL (ref 150–400)
RBC: 4.3 MIL/uL (ref 3.87–5.11)
RDW: 14.8 % (ref 11.5–15.5)
WBC: 8.4 10*3/uL (ref 4.0–10.5)

## 2014-12-13 LAB — I-STAT TROPONIN, ED
TROPONIN I, POC: 0 ng/mL (ref 0.00–0.08)
Troponin i, poc: 0 ng/mL (ref 0.00–0.08)

## 2014-12-13 LAB — TROPONIN I

## 2014-12-13 MED ORDER — ACETAMINOPHEN-CODEINE 120-12 MG/5ML PO SOLN: 10.0000 mL | ORAL | Status: DC | PRN

## 2014-12-13 MED ORDER — ASPIRIN 325 MG PO TABS
325.0000 mg | ORAL_TABLET | ORAL | Status: AC
  Administered 2014-12-13: 325 mg via ORAL
  Filled 2014-12-13: qty 1

## 2014-12-13 MED ORDER — DOXYCYCLINE HYCLATE 100 MG PO CAPS: 100.0000 mg | ORAL_CAPSULE | Freq: Two times a day (BID) | ORAL | Status: DC

## 2014-12-13 MED ORDER — ONDANSETRON HCL 4 MG/2ML IJ SOLN
4.0000 mg | Freq: Once | INTRAMUSCULAR | Status: AC
  Administered 2014-12-13: 4 mg via INTRAVENOUS
  Filled 2014-12-13: qty 2

## 2014-12-13 MED ORDER — IPRATROPIUM BROMIDE 0.02 % IN SOLN
0.5000 mg | Freq: Once | RESPIRATORY_TRACT | Status: AC
  Administered 2014-12-13: 0.5 mg via RESPIRATORY_TRACT
  Filled 2014-12-13: qty 2.5

## 2014-12-13 MED ORDER — ALBUTEROL (5 MG/ML) CONTINUOUS INHALATION SOLN
10.0000 mg/h | INHALATION_SOLUTION | RESPIRATORY_TRACT | Status: AC
  Administered 2014-12-13: 10 mg/h via RESPIRATORY_TRACT
  Filled 2014-12-13: qty 20

## 2014-12-13 MED ORDER — PREDNISONE 20 MG PO TABS
60.0000 mg | ORAL_TABLET | Freq: Once | ORAL | Status: AC
  Administered 2014-12-13: 60 mg via ORAL
  Filled 2014-12-13: qty 3

## 2014-12-13 MED ORDER — PREDNISONE 50 MG PO TABS: 50.0000 mg | ORAL_TABLET | Freq: Every day | ORAL | Status: DC

## 2014-12-13 MED ORDER — GUAIFENESIN ER 1200 MG PO TB12: 1.0000 | ORAL_TABLET | Freq: Two times a day (BID) | ORAL | Status: DC

## 2014-12-13 NOTE — Discharge Instructions (Signed)
Return here as needed.  Follow-up with your primary care Dr. for recheck your testing today did not show any abnormalities with your heart.  You also have a chest x-ray that does not show any signs of pneumonia at this point

## 2014-12-13 NOTE — ED Notes (Signed)
Pt reports using cocaine & marijuana yesterday

## 2014-12-13 NOTE — ED Notes (Signed)
Care transferred, report received Toniann FailWendy, [place].

## 2014-12-13 NOTE — ED Notes (Signed)
Pt in from home pt c/o productive cough with green sputum, pt c/o  L sided CP onset x 3 days ago, pt c/o SOB, n/v/d, pt c/o x 1 vomiting episodes & x 3 diarrhea episodes in 24 hrs, pt A&O x4, follows commands, speaks in complete sentences

## 2014-12-13 NOTE — ED Notes (Signed)
Ambulated pt in hallway with pulse ox. O2 levels dropped down to 94% but maintained around 96% most of the time. Pt's pulse was around 110-118. While ambulating, pt stated that her chest pressure became worse while walking.

## 2014-12-13 NOTE — ED Provider Notes (Signed)
CSN: [id number]     Arrival date & time 12/13/14  0804 History   First MD Initiated Contact with Patient 12/13/14 [phone]     Chief Complaint  Patient presents with  . Cough  . Chest Pain     (Consider location/radiation/quality/duration/timing/severity/associated sxs/prior Treatment) HPI Patient presents to the emergency department with cough with nasal congestion and body aches over the last week.  Patient states that he has a tightness in her chest.  Patient, states she has had some shortness of breath with nausea.  Patient denies abdominal pain, vomiting, diarrhea, weakness, dizziness, headache, blurred vision, fever, dysuria, back pain, neck pain, lightheadedness or syncope.  She states that she did not take any medications other than her prescribed medications prior to arrival.  Patient states nothing seems make her condition better, but exertion seems to medical for her symptoms worse Past Medical History  Diagnosis Date  . Nerve damage     hands  . Plantar fasciitis     bilateral  . Seizures     seizure disorder; last seizure 1 yr. ago  . Headache(784.0)     migraines  . Anxiety   . Mental disorder     anger issues  . Asthma     daily inhalers  . History of foot surgery     2 surgeries on left foot, 3 on right foot  . Carpal tunnel syndrome of left wrist 12/2012  . Sleep apnea     uses CPAP nightly   Past Surgical History  Procedure Laterality Date  . Elbow surgery Left   . Plantar fascia release    . Bunionectomy    . Hammer toe surgery    . Carpal tunnel release Left 12/26/2012    Procedure: CARPAL TUNNEL RELEASE;  Surgeon: Nicki ReaperGary R Kuzma, MD;  Location: [facility];  Service: Orthopedics;  Laterality: Left;  . Carpal tunnel release Right 01/12/2013    Procedure: Right Carpal Tunnel Release;  Surgeon: Nicki ReaperGary R Kuzma, MD;  Location: [facility];  Service: Orthopedics;  Laterality: Right;   No family history on file. History  Substance Use  Topics  . Smoking status: Current Every Day Smoker -- 0.50 packs/day for 30 years    Types: Cigarettes  . Smokeless tobacco: Never Used     Comment: 9 cig./day  . Alcohol Use: Yes     Comment: socially   OB History    No data available     Review of Systems  All other systems negative except as documented in the HPI. All pertinent positives and negatives as reviewed in the HPI.  Allergies  Review of patient's allergies indicates no known allergies.  Home Medications   Prior to Admission medications   Medication Sig Start Date End Date Taking? Authorizing Provider  albuterol (PROVENTIL HFA;VENTOLIN HFA) 108 (90 BASE) MCG/ACT inhaler Inhale 2 puffs into the lungs every 6 (six) hours as needed. For shortness of breath or wheeze    Yes Historical Provider, MD  budesonide-formoterol (SYMBICORT) 160-4.5 MCG/ACT inhaler Inhale 2 puffs into the lungs 3 (three) times daily.    Yes Historical Provider, MD  cholecalciferol (VITAMIN D) 1000 UNITS tablet Take 1,000 Units by mouth daily.   Yes Historical Provider, MD  HYDROcodone-acetaminophen (NORCO) 10-325 MG per tablet Take 1 tablet by mouth every 8 (eight) hours as needed for pain. 08/12/13  Yes Kirstie PeriNorman S Regal, DPM  ibuprofen (ADVIL,MOTRIN) 600 MG tablet Take 600 mg by mouth every 8 (eight) hours as  needed for moderate pain.   Yes Historical Provider, MD  levonorgestrel (MIRENA) 20 MCG/24HR IUD 1 each by Intrauterine route once.   Yes Historical Provider, MD  omeprazole (PRILOSEC) 10 MG capsule Take 10 mg by mouth 2 (two) times daily.   Yes Historical Provider, MD  Pseudoephedrine HCl (SUDAFED PO) Take 1 tablet by mouth daily as needed (for cold).   Yes Historical Provider, MD  QUEtiapine (SEROQUEL) 25 MG tablet Take 25 mg by mouth at bedtime.   Yes Historical Provider, MD  zolpidem (AMBIEN) 10 MG tablet Take 10 mg by mouth at bedtime as needed for sleep.   Yes Historical Provider, MD  sucralfate (CARAFATE) 1 G tablet Take 1 tablet (1 g total)  by mouth 4 (four) times daily. Patient not taking: Reported on 12/13/2014 11/04/13   Toy Baker, MD   BP 109/64 mmHg  Pulse 95  Temp(Src) 98.8 F (37.1 C) (Oral)  Resp 17  Ht  (1.803 m)  Wt 233 lb (105.688 kg)  BMI 32.51 kg/m2  SpO2 94% Physical Exam  Constitutional: She is oriented to person, place, and time. She appears well-developed and well-nourished. No distress.  HENT:  Head: Normocephalic and atraumatic.  Mouth/Throat: Oropharynx is clear and moist.  Eyes: Pupils are equal, round, and reactive to light.  Neck: Normal range of motion. Neck supple.  Cardiovascular: Normal rate, regular rhythm and normal heart sounds.  Exam reveals no gallop and no friction rub.   No murmur heard. Pulmonary/Chest: No respiratory distress. She has wheezes. She has no rales. She exhibits no tenderness.  Musculoskeletal: She exhibits no edema.  Neurological: She is alert and oriented to person, place, and time. She exhibits normal muscle tone. Coordination normal.  Skin: Skin is warm and dry. No rash noted. No erythema.  Nursing note and vitals reviewed.   ED Course  Procedures (including critical care time) Labs Review Labs Reviewed  BASIC METABOLIC PANEL - Abnormal; Notable for the following:    GFR calc non Af Amer 80 (*)    All other components within normal limits  CBC  TROPONIN I  I-STAT TROPOININ, ED  Rosezena Sensor, ED    Imaging Review Dg Chest 2 View  12/13/2014   CLINICAL DATA:  54 year old female with fever cough and congestion for 3-7 days. Initial encounter.  EXAM: CHEST  2 VIEW  COMPARISON:  11/04/2013 and earlier.  FINDINGS: Mildly increased interstitial markings in both lungs appear not significantly changed since 2014. No pneumothorax, pulmonary edema, pleural effusion or confluent pulmonary opacity. Normal cardiac size and mediastinal contours. Visualized tracheal air column is within normal limits. No acute osseous abnormality identified.  IMPRESSION: Mild  increased interstitial markings appear chronic, but viral/atypical respiratory infection is difficult to exclude in this setting.  Otherwise no acute cardiopulmonary abnormality.   Electronically Signed   By: Odessa Fleming M.D.   On: 12/13/2014 09:29     EKG Interpretation   Date/Time:  Monday December 13 2014 08:15:09 EST Ventricular Rate:  94 PR Interval:  142 QRS Duration: 82 QT Interval:  365 QTC Calculation: 456 R Axis:   -41 Text Interpretation:  Sinus rhythm Left axis deviation Low voltage,  precordial leads No significant change since last tracing Confirmed by YAO   MD, DAVID ([id number]) on 12/13/2014 8:32:29 AM     Patient is improved following an hour-long neb treatment and steroids.  The patient be discharged home with antibiotics, steroids and Mucinex.  The patient is advised to follow-up with her  primary care doctor.  She has 2 sets of negative cardiac enzymes.  She is maintaining her oxygen saturation while walking to the department.  Patient is advised return here for any worsening in her condition MDM   Final diagnoses:  None       Carlyle Dolly, PA-C 12/13/14 1550  Richardean Canal, MD 12/13/14 [phone]

## 2014-12-19 ENCOUNTER — Emergency Department (HOSPITAL_COMMUNITY): Payer: Commercial Managed Care - HMO

## 2014-12-19 ENCOUNTER — Encounter (HOSPITAL_COMMUNITY): Payer: Self-pay | Admitting: *Deleted

## 2014-12-19 ENCOUNTER — Emergency Department (HOSPITAL_COMMUNITY)
Admission: EM | Admit: 2014-12-19 | Discharge: 2014-12-19 | Disposition: A | Discharge status: Home / Self Care | Payer: Commercial Managed Care - HMO | Attending: Emergency Medicine | Admitting: Emergency Medicine

## 2014-12-19 DIAGNOSIS — Z793 Long term (current) use of hormonal contraceptives: Secondary | ICD-10-CM | POA: Diagnosis not present

## 2014-12-19 DIAGNOSIS — J45901 Unspecified asthma with (acute) exacerbation: Secondary | ICD-10-CM | POA: Diagnosis not present

## 2014-12-19 DIAGNOSIS — R109 Unspecified abdominal pain: Secondary | ICD-10-CM | POA: Insufficient documentation

## 2014-12-19 DIAGNOSIS — Z9981 Dependence on supplemental oxygen: Secondary | ICD-10-CM | POA: Insufficient documentation

## 2014-12-19 DIAGNOSIS — F419 Anxiety disorder, unspecified: Secondary | ICD-10-CM | POA: Insufficient documentation

## 2014-12-19 DIAGNOSIS — R112 Nausea with vomiting, unspecified: Secondary | ICD-10-CM | POA: Diagnosis not present

## 2014-12-19 DIAGNOSIS — Z720 Tobacco use: Secondary | ICD-10-CM | POA: Insufficient documentation

## 2014-12-19 DIAGNOSIS — M25511 Pain in right shoulder: Secondary | ICD-10-CM | POA: Diagnosis not present

## 2014-12-19 DIAGNOSIS — J40 Bronchitis, not specified as acute or chronic: Secondary | ICD-10-CM | POA: Diagnosis not present

## 2014-12-19 DIAGNOSIS — F17210 Nicotine dependence, cigarettes, uncomplicated: Secondary | ICD-10-CM | POA: Diagnosis not present

## 2014-12-19 DIAGNOSIS — Z79899 Other long term (current) drug therapy: Secondary | ICD-10-CM | POA: Insufficient documentation

## 2014-12-19 DIAGNOSIS — R0789 Other chest pain: Secondary | ICD-10-CM | POA: Diagnosis not present

## 2014-12-19 DIAGNOSIS — R110 Nausea: Secondary | ICD-10-CM | POA: Diagnosis not present

## 2014-12-19 DIAGNOSIS — G4730 Sleep apnea, unspecified: Secondary | ICD-10-CM | POA: Diagnosis not present

## 2014-12-19 DIAGNOSIS — R0602 Shortness of breath: Secondary | ICD-10-CM | POA: Diagnosis not present

## 2014-12-19 DIAGNOSIS — Z7952 Long term (current) use of systemic steroids: Secondary | ICD-10-CM | POA: Insufficient documentation

## 2014-12-19 DIAGNOSIS — Z8739 Personal history of other diseases of the musculoskeletal system and connective tissue: Secondary | ICD-10-CM | POA: Insufficient documentation

## 2014-12-19 DIAGNOSIS — R05 Cough: Secondary | ICD-10-CM | POA: Diagnosis present

## 2014-12-19 DIAGNOSIS — Z87828 Personal history of other (healed) physical injury and trauma: Secondary | ICD-10-CM | POA: Insufficient documentation

## 2014-12-19 DIAGNOSIS — Z7951 Long term (current) use of inhaled steroids: Secondary | ICD-10-CM | POA: Insufficient documentation

## 2014-12-19 DIAGNOSIS — R079 Chest pain, unspecified: Secondary | ICD-10-CM | POA: Diagnosis not present

## 2014-12-19 DIAGNOSIS — R630 Anorexia: Secondary | ICD-10-CM | POA: Diagnosis not present

## 2014-12-19 LAB — COMPREHENSIVE METABOLIC PANEL
ALK PHOS: 79 U/L (ref 39–117)
ALT: 17 U/L (ref 0–35)
ANION GAP: 10 (ref 5–15)
AST: 15 U/L (ref 0–37)
Albumin: 3.2 g/dL — ABNORMAL LOW (ref 3.5–5.2)
BUN: 12 mg/dL (ref 6–23)
CO2: 27 mmol/L (ref 19–32)
CREATININE: 1.02 mg/dL (ref 0.50–1.10)
Calcium: 9 mg/dL (ref 8.4–10.5)
Chloride: 98 mmol/L (ref 96–112)
GFR calc non Af Amer: 62 mL/min — ABNORMAL LOW (ref >90)
GFR, EST AFRICAN AMERICAN: 72 mL/min — AB (ref >90)
Glucose, Bld: 122 mg/dL — ABNORMAL HIGH (ref 70–99)
Potassium: 3.8 mmol/L (ref 3.5–5.1)
Sodium: 135 mmol/L (ref 135–145)
Total Bilirubin: 0.4 mg/dL (ref 0.3–1.2)
Total Protein: 6.7 g/dL (ref 6.0–8.3)

## 2014-12-19 LAB — CBC WITH DIFFERENTIAL/PLATELET
Basophils Absolute: 0 10*3/uL (ref 0.0–0.1)
Basophils Relative: 0 % (ref 0–1)
Eosinophils Absolute: 0.2 10*3/uL (ref 0.0–0.7)
Eosinophils Relative: 2 % (ref 0–5)
HCT: 39.4 % (ref 36.0–46.0)
Hemoglobin: 13.1 g/dL (ref 12.0–15.0)
LYMPHS ABS: 4.1 10*3/uL — AB (ref 0.7–4.0)
Lymphocytes Relative: 37 % (ref 12–46)
MCH: 29.8 pg (ref 26.0–34.0)
MCHC: 33.2 g/dL (ref 30.0–36.0)
MCV: 89.5 fL (ref 78.0–100.0)
MONOS PCT: 9 % (ref 3–12)
Monocytes Absolute: 1 10*3/uL (ref 0.1–1.0)
NEUTROS ABS: 5.9 10*3/uL (ref 1.7–7.7)
Neutrophils Relative %: 52 % (ref 43–77)
Platelets: 343 10*3/uL (ref 150–400)
RBC: 4.4 MIL/uL (ref 3.87–5.11)
RDW: 14.8 % (ref 11.5–15.5)
WBC: 11.2 10*3/uL — ABNORMAL HIGH (ref 4.0–10.5)

## 2014-12-19 LAB — LIPASE, BLOOD: LIPASE: 24 U/L (ref 11–59)

## 2014-12-19 LAB — I-STAT TROPONIN, ED: Troponin i, poc: 0 ng/mL (ref 0.00–0.08)

## 2014-12-19 MED ORDER — GUAIFENESIN 100 MG/5ML PO LIQD: 100.0000 mg | ORAL | Status: DC | PRN

## 2014-12-19 MED ORDER — ONDANSETRON 8 MG PO TBDP: 8.0000 mg | ORAL_TABLET | Freq: Three times a day (TID) | ORAL | Status: DC | PRN

## 2014-12-19 MED ORDER — HYDROCOD POLST-CHLORPHEN POLST 10-8 MG/5ML PO LQCR
5.0000 mL | Freq: Once | ORAL | Status: AC
  Administered 2014-12-19: 5 mL via ORAL
  Filled 2014-12-19: qty 5

## 2014-12-19 MED ORDER — IPRATROPIUM-ALBUTEROL 0.5-2.5 (3) MG/3ML IN SOLN
3.0000 mL | Freq: Once | RESPIRATORY_TRACT | Status: AC
Start: 2014-12-19 — End: 2014-12-19
  Administered 2014-12-19: 3 mL via RESPIRATORY_TRACT
  Filled 2014-12-19: qty 3

## 2014-12-19 MED ORDER — OXYCODONE-ACETAMINOPHEN 5-325 MG PO TABS
1.0000 | ORAL_TABLET | Freq: Once | ORAL | Status: AC
  Administered 2014-12-19: 1 via ORAL
  Filled 2014-12-19: qty 1

## 2014-12-19 MED ORDER — ONDANSETRON 4 MG PO TBDP
8.0000 mg | ORAL_TABLET | Freq: Once | ORAL | Status: AC
  Administered 2014-12-19: 8 mg via ORAL
  Filled 2014-12-19: qty 2

## 2014-12-19 MED ORDER — HYDROCODONE-ACETAMINOPHEN 5-325 MG PO TABS: 1.0000 | ORAL_TABLET | Freq: Four times a day (QID) | ORAL | Status: DC | PRN

## 2014-12-19 NOTE — ED Notes (Signed)
pt states that limited extremity movement  is unusual for her,  pt states pain started Friday, pt denies trauma

## 2014-12-19 NOTE — ED Notes (Signed)
Patient presents with c/o feeling nauseated and has not felt any better from being here last week and placed on anitibiotics, prednisone, cough med and Mucinex

## 2014-12-19 NOTE — ED Provider Notes (Signed)
CSN: [id number]     Arrival date & time 12/19/14  0608 History   First MD Initiated Contact with Patient 12/19/14 [phone]     Chief Complaint  Patient presents with  . Nausea  . Cough     (Consider location/radiation/quality/duration/timing/severity/associated sxs/prior Treatment) HPI Alyssa Schwartz is a 54 y.o. female with history of asthma, presents to emergency department complaining of cough, chest pain, nausea, vomiting. Patient states she has had cough for 2 weeks now. She has been using her rescue inhaler every 2 hours at home. She has had a neb treatment 2 days ago at home. She was also seen here 6 days ago, diagnosed with bronchitis, treated with prednisone, Mucinex, doxycycline, and head foci of labs including 2 sets of enzymes while in the emergency department. Patient states since to discharge her symptoms did not improve. She has taken all of her medications as prescribed. She states she continues to have chest pain that is retrosternal, radiates into the right shoulder. Pain is constant. It's worsened with cough. Worsened with palpation of the chest. States also has developed worsening of the right shoulder pain. Pain with movement of the arm. She states she has been vomiting constantly for the last several days. States unable to eat anything due to nausea and vomiting. Denies any blood in her stool or emesis. No diarrhea. States she has had intermittent upper abdominal pain, none at this time. She has not taken any for the pain. She denies any fever or chills. She admits to smoking.  Past Medical History  Diagnosis Date  . Nerve damage     hands  . Plantar fasciitis     bilateral  . Seizures     seizure disorder; last seizure 1 yr. ago  . Headache(784.0)     migraines  . Anxiety   . Mental disorder     anger issues  . Asthma     daily inhalers  . History of foot surgery     2 surgeries on left foot, 3 on right foot  . Carpal tunnel syndrome of left wrist 12/2012  . Sleep apnea      uses CPAP nightly   Past Surgical History  Procedure Laterality Date  . Elbow surgery Left   . Plantar fascia release    . Bunionectomy    . Hammer toe surgery    . Carpal tunnel release Left 12/26/2012    Procedure: CARPAL TUNNEL RELEASE;  Surgeon: Nicki ReaperGary R Kuzma, MD;  Location: [facility];  Service: Orthopedics;  Laterality: Left;  . Carpal tunnel release Right 01/12/2013    Procedure: Right Carpal Tunnel Release;  Surgeon: Nicki ReaperGary R Kuzma, MD;  Location: [facility];  Service: Orthopedics;  Laterality: Right;   No family history on file. History  Substance Use Topics  . Smoking status: Current Every Day Smoker -- 0.50 packs/day for 30 years    Types: Cigarettes  . Smokeless tobacco: Never Used     Comment: 9 cig./day  . Alcohol Use: Yes     Comment: socially   OB History    No data available     Review of Systems  Constitutional: Negative for fever and chills.  Respiratory: Positive for cough, chest tightness and shortness of breath.   Cardiovascular: Positive for chest pain. Negative for palpitations and leg swelling.  Gastrointestinal: Positive for nausea, vomiting and abdominal pain. Negative for diarrhea.  Genitourinary: Negative for dysuria, flank pain and pelvic pain.  Musculoskeletal: Positive for  arthralgias. Negative for myalgias, neck pain and neck stiffness.  Skin: Negative for rash.  Neurological: Negative for dizziness, weakness and headaches.  All other systems reviewed and are negative.     Allergies  Review of patient's allergies indicates no known allergies.  Home Medications   Prior to Admission medications   Medication Sig Start Date End Date Taking? Authorizing Provider  albuterol (PROVENTIL HFA;VENTOLIN HFA) 108 (90 BASE) MCG/ACT inhaler Inhale 2 puffs into the lungs every 6 (six) hours as needed. For shortness of breath or wheeze    Yes Historical Provider, MD  budesonide-formoterol (SYMBICORT) 160-4.5 MCG/ACT inhaler  Inhale 2 puffs into the lungs 3 (three) times daily.    Yes Historical Provider, MD  cholecalciferol (VITAMIN D) 1000 UNITS tablet Take 1,000 Units by mouth daily.   Yes Historical Provider, MD  HYDROcodone-acetaminophen (NORCO) 10-325 MG per tablet Take 1 tablet by mouth every 8 (eight) hours as needed for pain. 08/12/13  Yes Kirstie Peri Regal, DPM  ibuprofen (ADVIL,MOTRIN) 800 MG tablet Take 800 mg by mouth every 8 (eight) hours as needed for moderate pain.   Yes Historical Provider, MD  omeprazole (PRILOSEC) 10 MG capsule Take 10 mg by mouth 2 (two) times daily.   Yes Historical Provider, MD  predniSONE (DELTASONE) 50 MG tablet Take 1 tablet (50 mg total) by mouth daily. 12/13/14  Yes Jamesetta Orleans Lawyer, PA-C  QUEtiapine (SEROQUEL) 25 MG tablet Take 25 mg by mouth at bedtime.   Yes Historical Provider, MD  zolpidem (AMBIEN) 10 MG tablet Take 10 mg by mouth at bedtime as needed for sleep.   Yes Historical Provider, MD  acetaminophen-codeine 120-12 MG/5ML solution Take 10 mLs by mouth every 4 (four) hours as needed for moderate pain. Patient not taking: Reported on 12/19/2014 12/13/14   Jamesetta Orleans Lawyer, PA-C  doxycycline (VIBRAMYCIN) 100 MG capsule Take 1 capsule (100 mg total) by mouth 2 (two) times daily. Patient not taking: Reported on 12/19/2014 12/13/14   Jamesetta Orleans Lawyer, PA-C  Guaifenesin 1200 MG TB12 Take 1 tablet (1,200 mg total) by mouth 2 (two) times daily. Patient not taking: Reported on 12/19/2014 12/13/14   Jamesetta Orleans Lawyer, PA-C  levonorgestrel (MIRENA) 20 MCG/24HR IUD 1 each by Intrauterine route once.    Historical Provider, MD  sucralfate (CARAFATE) 1 G tablet Take 1 tablet (1 g total) by mouth 4 (four) times daily. Patient not taking: Reported on 12/13/2014 11/04/13   Toy Baker, MD   BP 102/60 mmHg  Pulse 88  Temp(Src) 99.3 F (37.4 C) (Oral)  Resp 12  Ht  (1.803 m)  Wt 240 lb (108.863 kg)  BMI 33.49 kg/m2  SpO2 100% Physical Exam  Constitutional: She is  oriented to person, place, and time. She appears well-developed and well-nourished. No distress.  HENT:  Head: Normocephalic.  Eyes: Conjunctivae are normal.  Neck: Neck supple.  Cardiovascular: Normal rate, regular rhythm and normal heart sounds.   Pulmonary/Chest: Effort normal and breath sounds normal. No respiratory distress. She has no wheezes. She has no rales. She exhibits tenderness.  Chest is diffusely tender mainly in the upper retrosternal area and right upper chest.  Abdominal: Soft. Bowel sounds are normal. She exhibits no distension. There is no tenderness. There is no rebound and no guarding.  Musculoskeletal: She exhibits no edema.  Diffuse tenderness to palpation of the right shoulder. Pain with range of motion of the right shoulder in any direction. No swelling, erythema.  Neurological: She is alert and oriented  to person, place, and time.  Skin: Skin is warm and dry.  Psychiatric: She has a normal mood and affect. Her behavior is normal.  Nursing note and vitals reviewed.   ED Course  Procedures (including critical care time) Labs Review Labs Reviewed  CBC WITH DIFFERENTIAL/PLATELET - Abnormal; Notable for the following:    WBC 11.2 (*)    Lymphs Abs 4.1 (*)    All other components within normal limits  COMPREHENSIVE METABOLIC PANEL - Abnormal; Notable for the following:    Glucose, Bld 122 (*)    Albumin 3.2 (*)    GFR calc non Af Amer 62 (*)    GFR calc Af Amer 72 (*)    All other components within normal limits  LIPASE, BLOOD  I-STAT TROPOININ, ED    Imaging Review Dg Chest 2 View  12/19/2014   CLINICAL DATA:  54 year old female with mid sternal chest pain, shortness of breath and weakness as well as right shoulder pain for the past 2 weeks. Current smoker with at least 30 pack year history  EXAM: CHEST  2 VIEW  COMPARISON:  None.  FINDINGS: The lungs are clear and negative for focal airspace consolidation, pulmonary edema or suspicious pulmonary nodule.  Very mild pulmonary hyperinflation and early central bronchitic change. No pleural effusion or pneumothorax. Cardiac and mediastinal contours are within normal limits. No acute fracture or lytic or blastic osseous lesions. The visualized upper abdominal bowel gas pattern is unremarkable.  IMPRESSION: No active cardiopulmonary disease.  Very mild pulmonary hyperinflation and central bronchitic changes.   Electronically Signed   By: Malachy Moan M.D.   On: 12/19/2014 08:45   US Abdomen Complete  12/19/2014   CLINICAL DATA:  Nausea and cough.  EXAM: ULTRASOUND ABDOMEN COMPLETE  COMPARISON:  None.  FINDINGS: Gallbladder: No gallstones or wall thickening visualized. No sonographic Murphy sign noted.  Common bile duct: Diameter: Normal at 3 mm  Liver: No focal lesion identified. Within normal limits in parenchymal echogenicity.  IVC: No abnormality visualized.  Pancreas: No inflammation or duct dilatation  Spleen: Size and appearance within normal limits.  Right Kidney: Length: 10.9. Echogenicity within normal limits. No mass or hydronephrosis visualized.  Left Kidney: Length: 10.2 cm. Echogenicity within normal limits. No mass or hydronephrosis visualized.  Abdominal aorta: No aneurysm visualized.  Other findings: No free-fluid  IMPRESSION: Normal abdominal ultrasound.   Electronically Signed   By: Genevive Bi M.D.   On: 12/19/2014 12:21      Date: 12/19/2014  Rate: 93  Rhythm: normal sinus rhythm  QRS Axis: left  Intervals: normal  ST/T Wave abnormalities: nonspecific ST changes  Conduction Disutrbances:none  Narrative Interpretation:   Old EKG Reviewed: unchanged   MDM   Final diagnoses:  Bronchitis  Non-intractable vomiting with nausea, vomiting of unspecified type  Shoulder pain, right    Patient with persistent chest pain, cough, nausea, vomiting. Chest pain is atypical, it has been constant, reproducible with palpation of the chest. Will recheck EKG and one set of enzymes. Will  check a chest x-ray. Patient recently finished prednisone, doxycycline, using inhalers at home, with no improvement in her cough. She continues to smoke. We'll try neb treatment here. Tussionex ordered for cough and pain.   10:20 AM Labs unremarkable. Pt continues to complain pain in the right shoulder, with that being the main complant at this time. Given nausea, vomiting, intermittent upper abdominal pain with non traumatic right shoulder pain, will get [place] abd to evaluate for cholecystitis.  Will give a percocet for pain.   12:50 PM [place] negative. Pt requesting pain medications, states she normally takes norco, states she ran out early. Her refill comes in the mail and wont be here for another 5 days. Will d/c home with prescription for 15 norco. Advised to call her doctor. From cardiac stand point, her cp is atypical, mostly chest wall. most likely from bronchitis. She is afebrile. Non toxic. No distress. Stable for d/c home. She is not tachycardic, tachypnec, hypoxic, doubt PE. Work up here is negative.   Lottie Mussel, PA-C 12/19/14 1623  Arby Barrette, MD 12/22/14 [phone]

## 2014-12-19 NOTE — Discharge Instructions (Signed)
zofran for nausea. Robitussin for cough. norco for pain. Follow up with your doctor. Make sure to finish all of your prior prescribed medications.   Acute Bronchitis Bronchitis is inflammation of the airways that extend from the windpipe into the lungs (bronchi). The inflammation often causes mucus to develop. This leads to a cough, which is the most common symptom of bronchitis.  In acute bronchitis, the condition usually develops suddenly and goes away over time, usually in a couple weeks. Smoking, allergies, and asthma can make bronchitis worse. Repeated episodes of bronchitis may cause further lung problems.  CAUSES Acute bronchitis is most often caused by the same virus that causes a cold. The virus can spread from person to person (contagious) through coughing, sneezing, and touching contaminated objects. SIGNS AND SYMPTOMS   Cough.   Fever.   Coughing up mucus.   Body aches.   Chest congestion.   Chills.   Shortness of breath.   Sore throat.  DIAGNOSIS  Acute bronchitis is usually diagnosed through a physical exam. Your health care provider will also ask you questions about your medical history. Tests, such as chest X-rays, are sometimes done to rule out other conditions.  TREATMENT  Acute bronchitis usually goes away in a couple weeks. Oftentimes, no medical treatment is necessary. Medicines are sometimes given for relief of fever or cough. Antibiotic medicines are usually not needed but may be prescribed in certain situations. In some cases, an inhaler may be recommended to help reduce shortness of breath and control the cough. A cool mist vaporizer may also be used to help thin bronchial secretions and make it easier to clear the chest.  HOME CARE INSTRUCTIONS  Get plenty of rest.   Drink enough fluids to keep your urine clear or pale yellow (unless you have a medical condition that requires fluid restriction). Increasing fluids may help thin your respiratory  secretions (sputum) and reduce chest congestion, and it will prevent dehydration.   Take medicines only as directed by your health care provider.  If you were prescribed an antibiotic medicine, finish it all even if you start to feel better.  Avoid smoking and secondhand smoke. Exposure to cigarette smoke or irritating chemicals will make bronchitis worse. If you are a smoker, consider using nicotine gum or skin patches to help control withdrawal symptoms. Quitting smoking will help your lungs heal faster.   Reduce the chances of another bout of acute bronchitis by washing your hands frequently, avoiding people with cold symptoms, and trying not to touch your hands to your mouth, nose, or eyes.   Keep all follow-up visits as directed by your health care provider.  SEEK MEDICAL CARE IF: Your symptoms do not improve after 1 week of treatment.  SEEK IMMEDIATE MEDICAL CARE IF:  You develop an increased fever or chills.   You have chest pain.   You have severe shortness of breath.  You have bloody sputum.   You develop dehydration.  You faint or repeatedly feel like you are going to pass out.  You develop repeated vomiting.  You develop a severe headache. MAKE SURE YOU:   Understand these instructions.  Will watch your condition.  Will get help right away if you are not doing well or get worse. Document Released: 11/15/2004 Document Revised: 02/22/2014 Document Reviewed: 03/31/2013 [facility] Patient Information 2015 [place], [place]. This information is not intended to replace advice given to you by your health care provider. Make sure you discuss any questions you have with your health  care provider.  Chest Wall Pain Chest wall pain is pain in or around the bones and muscles of your chest. It may take up to 6 weeks to get better. It may take longer if you must stay physically active in your work and activities.  CAUSES  Chest wall pain may happen on its own. However, it  may be caused by:  A viral illness like the flu.  Injury.  Coughing.  Exercise.  Arthritis.  Fibromyalgia.  Shingles. HOME CARE INSTRUCTIONS   Avoid overtiring physical activity. Try not to strain or perform activities that cause pain. This includes any activities using your chest or your abdominal and side muscles, especially if heavy weights are used.  Put ice on the sore area.  Put ice in a plastic bag.  Place a towel between your skin and the bag.  Leave the ice on for 15-20 minutes per hour while awake for the first 2 days.  Only take over-the-counter or prescription medicines for pain, discomfort, or fever as directed by your caregiver. SEEK IMMEDIATE MEDICAL CARE IF:   Your pain increases, or you are very uncomfortable.  You have a fever.  Your chest pain becomes worse.  You have new, unexplained symptoms.  You have nausea or vomiting.  You feel sweaty or lightheaded.  You have a cough with phlegm (sputum), or you cough up blood. MAKE SURE YOU:   Understand these instructions.  Will watch your condition.  Will get help right away if you are not doing well or get worse. Document Released: 10/08/2005 Document Revised: 12/31/2011 Document Reviewed: 06/04/2011 [facility] Patient Information 2015 [place], [place]. This information is not intended to replace advice given to you by your health care provider. Make sure you discuss any questions you have with your health care provider.   Emergency Department Resource Guide 1) Find a Doctor and Pay Out of Pocket Although you won't have to find out who is covered by your insurance plan, it is a good idea to ask around and get recommendations. You will then need to call the office and see if the doctor you have chosen will accept you as a new patient and what types of options they offer for patients who are self-pay. Some doctors offer discounts or will set up payment plans for their patients who do not have insurance,  but you will need to ask so you aren't surprised when you get to your appointment.  2) Contact Your Local Health Department Not all health departments have doctors that can see patients for sick visits, but many do, so it is worth a call to see if yours does. If you don't know where your local health department is, you can check in your phone book. The CDC also has a tool to help you locate your state's health department, and many state websites also have listings of all of their local health departments.  3) Find a Walk-in Clinic If your illness is not likely to be very severe or complicated, you may want to try a walk in clinic. These are popping up all over the country in pharmacies, drugstores, and shopping centers. They're usually staffed by nurse practitioners or physician assistants that have been trained to treat common illnesses and complaints. They're usually fairly quick and inexpensive. However, if you have serious medical issues or chronic medical problems, these are probably not your best option.  No Primary Care Doctor: - Call Health Connect at  [phone] - they can help you locate a  primary care doctor that  accepts your insurance, provides certain services, etc. - Physician Referral Service- [phone]  Chronic Pain Problems: Organization         Address  Phone   Notes  [facility]  [phone] Patients need to be referred by their primary care doctor.   Medication Assistance: Organization         Address  Phone   Notes  [facility] Medication [facility] [address] [place], [place] [address] [phone] --Must be a resident of [facility] -- Must have NO insurance coverage whatsoever (no Medicaid/ Medicare, etc.) -- The pt. MUST have a primary care doctor that directs their care regularly and follows them in the community   MedAssist  [phone]   [employer]  [phone]) [phone]    Agencies that provide inexpensive  medical care: Organization         Address  Phone   Notes  [facility]  [phone]   [facility]    [phone]   [facility] [address] [place], [place] [address] [phone]) [phone]   [facility] [address], [place] [phone]) [phone]   Planned Parenthood    [phone]   [facility]    [phone]   [facility]  [address], [place] Phone:  [phone], Fax:  [phone]) [phone] Hours of Operation:  9 am - 6 pm, M-F.  Also accepts Medicaid/Medicare and self-pay.  [facility]  [address], [place] Phone: [phone]) [phone], Fax: [phone]. Hours of Operation:  8:30 am - 5:30 pm, M-F.  Also accepts Medicaid and self-pay.  [facility] [place] [address], [place] Phone: [phone]) [phone]   [facility] [address], [place], [place] [phone], Ext. [phone] Mondays & Thursdays: 7-9 AM.  First 15 patients are seen on a first come, first serve basis.    Medicaid-accepting [facility] Providers:  Organization         Address  Phone   Notes  [facility] [address], [place] [phone] Also accepts self-pay patients.  [facility] [address], [place]  [phone]) [phone]   [facility] [address], [place] [phone]   [facility] [address] [place] [address], [place] [phone]) [phone]   Renaye RakersVeita Bland [address], [place]   [phone] Only accepts [place] Access [place] patients after they have their name applied to their card.   Self-Pay (no insurance) in [facility]:  Organization         Address  Phone   Notes  Sickle Cell Patients, [facility] [address], [place] [phone]   [facility] [address], [place] [phone]   [facility] [place]  [address], [place] [phone]   [facility]/Dr. Osei-Bonsu  [address] [place] [address], [place] or [phone], [place] [phone] Phone number for both [place] and [place] locations is the same.  [facility] [address], [place] [phone]   [facility] [address] [place] [address], [place] or [address] [phone]   [facility] [address] [place] [phone], phone; [phone], fax Sees patients 1st and 3rd Saturday of every month.  Must not qualify for public or private insurance (i.e. Medicaid, Medicare, [place] Health Choice, Veterans' Benefits)  Household income should be no more than 200% of the poverty level The clinic cannot treat you if you are pregnant or think you are pregnant  Sexually transmitted diseases are not treated at the clinic.    Dental Care: Organization         Address  Phone  Notes  [facility] [address] [place], [place] [phone] Accepts children up to age 70 who are enrolled in [place] or [place] Health Choice; pregnant women with a Medicaid card; and children who have applied for Medicaid or [place] Health Choice, but were declined, whose parents can pay a reduced fee at time of service.  [facility]  [address], [place] [phone] Accepts children up to age 60 who are enrolled in [place] or [place] Health Choice; pregnant women with a Medicaid card; and children who have applied for Medicaid or [place] Health Choice, but were declined, whose parents can pay a reduced fee at time of service.  [facility]  [address] [place], [place] [phone] Patients are seen by appointment only. Walk-ins are not accepted.  [facility] will see patients 44 years of age and older. Monday - Tuesday (8am-5pm) Most Wednesdays (8:30-5pm) $30 per visit, cash only  [facility] Adult Dental Access PROGRAM  [address], [facility] [phone] Patients are seen by appointment only. Walk-ins are not accepted. [facility] will see patients 21 years of age and older. One Wednesday Evening (Monthly: Volunteer Based).  $30 per visit, cash only  [employer]  [phone] for adults; Children under age 66, call Graduate Pediatric Dentistry at [phone]. Children aged 57-14, please call [phone] to request a pediatric application.  Dental services are provided in all areas of dental care including fillings, crowns and bridges, complete and partial dentures, implants, gum treatment, root canals, and extractions. Preventive care is also provided. Treatment is provided to both adults and children. Patients are selected via a lottery and there is often a waiting list.   [facility] [address] [place]  [phone] [email or web address]   [facility] [address] [place], [place] [phone], Ext. [phone] Second and Fourth Thursday of each month, opens at 6:30 AM; Clinic ends at 9 AM.  Patients are seen on a first-come first-served basis, and a limited number are seen during each clinic.   [facility]  [address] Ether [place], [place] [phone]   Eligibility Requirements You must have lived in [place], [place], or [place] counties for at least the last three months.   You cannot be eligible for state or federal sponsored [place], including [employer], [place], or [employer].   You generally cannot be eligible for healthcare insurance through your employer.    How to apply: Eligibility screenings are held every Tuesday and Wednesday afternoon from 1:00 pm until 4:00 pm. You do not need an appointment for the  interview!  [facility] [address] [place], [place] [phone]   [facility] Health Department  [phone]   [facility] Health Department  [phone]  [place] in the Community: Intensive Outpatient Programs Organization         Address  Phone  Notes  [place]. [address] [place], [place] [phone]   [facility]patient [address], [place], [place]   ADS: Alcohol & Drug Svcs [address] [place], [place]   [place] [address],  [place], [place] or [phone]   Substance Abuse Resources Organization         Address  Phone  Notes  Alcohol and Drug Services  [phone]   [place]  [phone]   [facility]   Chinita Pester  [phone]   Residential & Outpatient Substance Abuse Program  [phone]   Psychological Services Organization         Address  Phone  Notes  [facility] [place]  [place]  [phone]   [place] [address] [place] or [phone]    Mobile Crisis Teams Organization         Address  Phone  Notes  [facility], Mobile Crisis Care Unit  [phone]   [facility]  [address] [place], [place]   [place] Levels [address] [place] [phone]    Self-Help/Support Groups Organization         Address  Phone             Notes  [place]. of [place] - variety of support groups  [place] Call for more information  Narcotics Anonymous (NA), Caring Services [address], [employer]  2 meetings at this location   Special educational needs teacher         Address  Phone  Notes  ASAP Residential Treatment  [place],    [place]  1-5181394172   [facility]  [address] [place] [address]676, [place], [place]   [place], [place] [phone] Admissions: 8am-3pm M-F  Incentives Substance [place] [address],    [place], [place] [phone]   [facility] [address] [place], [place], [place]   [facility] [address]  [place], [place]   Insight Programs - Intensive Outpatient [address], [place], [place]   [facility] [place].,  [place], [place] [phone] or [phone]   [facility] (RTS) [address], [place], [place] Accepts Medicaid  [facility] [place] [address],  [place] [phone] Substance Abuse/Addiction Treatment   [facility] Organization         Address  Phone  Notes  [facility]  [phone]   Domenic Schwab, PhD [address] Arlis Porta [place], [place]   [phone] or [phone]   [place], [place] [phone]   [place] [address], [place], [place] [phone] Insurance/Medicaid/sponsorship through [employer] and Families [address], [place]                                    [place], [place] [phone] Therapy/tele-psych/case  [facility] [place], [place] [phone]    Dr. Adele Schilder  [phone]   [facility] Dept. 1) [address] [place] 2) [place] 3)  [address], [place] [phone]  [phone]   [place] [phone] or [phone] (After Hours)

## 2015-05-02 ENCOUNTER — Emergency Department (HOSPITAL_COMMUNITY)
Admission: EM | Admit: 2015-05-02 | Discharge: 2015-05-02 | Disposition: A | Discharge status: Home / Self Care | Payer: Commercial Managed Care - HMO | Attending: Emergency Medicine | Admitting: Emergency Medicine

## 2015-05-02 ENCOUNTER — Encounter (HOSPITAL_COMMUNITY): Payer: Self-pay | Admitting: Emergency Medicine

## 2015-05-02 DIAGNOSIS — R1011 Right upper quadrant pain: Secondary | ICD-10-CM | POA: Diagnosis not present

## 2015-05-02 DIAGNOSIS — Z9981 Dependence on supplemental oxygen: Secondary | ICD-10-CM | POA: Diagnosis not present

## 2015-05-02 DIAGNOSIS — R059 Cough, unspecified: Secondary | ICD-10-CM

## 2015-05-02 DIAGNOSIS — J45909 Unspecified asthma, uncomplicated: Secondary | ICD-10-CM | POA: Insufficient documentation

## 2015-05-02 DIAGNOSIS — Z720 Tobacco use: Secondary | ICD-10-CM | POA: Diagnosis not present

## 2015-05-02 DIAGNOSIS — R05 Cough: Secondary | ICD-10-CM

## 2015-05-02 DIAGNOSIS — Z87828 Personal history of other (healed) physical injury and trauma: Secondary | ICD-10-CM | POA: Diagnosis not present

## 2015-05-02 DIAGNOSIS — Z7952 Long term (current) use of systemic steroids: Secondary | ICD-10-CM | POA: Diagnosis not present

## 2015-05-02 DIAGNOSIS — Z79899 Other long term (current) drug therapy: Secondary | ICD-10-CM | POA: Diagnosis not present

## 2015-05-02 DIAGNOSIS — R509 Fever, unspecified: Secondary | ICD-10-CM

## 2015-05-02 DIAGNOSIS — G4730 Sleep apnea, unspecified: Secondary | ICD-10-CM | POA: Insufficient documentation

## 2015-05-02 DIAGNOSIS — Z8659 Personal history of other mental and behavioral disorders: Secondary | ICD-10-CM | POA: Insufficient documentation

## 2015-05-02 DIAGNOSIS — J069 Acute upper respiratory infection, unspecified: Secondary | ICD-10-CM | POA: Diagnosis not present

## 2015-05-02 DIAGNOSIS — M791 Myalgia, unspecified site: Secondary | ICD-10-CM

## 2015-05-02 MED ORDER — MAGIC MOUTHWASH
5.0000 mL | Freq: Once | ORAL | Status: AC
  Administered 2015-05-02: 5 mL via ORAL
  Filled 2015-05-02: qty 5

## 2015-05-02 MED ORDER — IBUPROFEN 200 MG PO TABS
600.0000 mg | ORAL_TABLET | Freq: Once | ORAL | Status: AC
  Administered 2015-05-02: 600 mg via ORAL
  Filled 2015-05-02 (×2): qty 1

## 2015-05-02 MED ORDER — ONDANSETRON 4 MG PO TBDP
4.0000 mg | ORAL_TABLET | Freq: Once | ORAL | Status: AC
  Administered 2015-05-02: 4 mg via ORAL
  Filled 2015-05-02: qty 1

## 2015-05-02 MED ORDER — ONDANSETRON 4 MG PO TBDP: 4.0000 mg | ORAL_TABLET | Freq: Three times a day (TID) | ORAL | Status: DC | PRN

## 2015-05-02 NOTE — ED Notes (Signed)
Pt. Had 20 dollar bag of cocaine 3 days ago.

## 2015-05-02 NOTE — ED Provider Notes (Signed)
CSN: [id number]     Arrival date & time 05/02/15  0932 History   First MD Initiated Contact with Patient 05/02/15 1005     Chief Complaint  Patient presents with  . Chest Pain  . Generalized Body Aches  . Shortness of Breath   (Consider location/radiation/quality/duration/timing/severity/associated sxs/prior Treatment) Patient is a 54 y.o. female presenting with URI. The history is provided by the patient. No language interpreter was used.  URI Presenting symptoms: cough, fatigue, fever and sore throat   Severity:  Mild Onset quality:  Gradual Timing:  Constant Progression:  Worsening Chronicity:  New Relieved by:  None tried Worsened by:  Nothing tried Ineffective treatments:  None tried Associated symptoms: arthralgias, headaches and myalgias   Associated symptoms: no neck pain, no sinus pain, no sneezing and no wheezing   Risk factors: chronic respiratory disease   Risk factors: not elderly, no chronic kidney disease, no diabetes mellitus, no immunosuppression, no recent illness, no recent travel and no sick contacts     Past Medical History  Diagnosis Date  . Nerve damage     hands  . Plantar fasciitis     bilateral  . Seizures     seizure disorder; last seizure 1 yr. ago  . Headache(784.0)     migraines  . Anxiety   . Mental disorder     anger issues  . Asthma     daily inhalers  . History of foot surgery     2 surgeries on left foot, 3 on right foot  . Carpal tunnel syndrome of left wrist 12/2012  . Sleep apnea     uses CPAP nightly   Past Surgical History  Procedure Laterality Date  . Elbow surgery Left   . Plantar fascia release    . Bunionectomy    . Hammer toe surgery    . Carpal tunnel release Left 12/26/2012    Procedure: CARPAL TUNNEL RELEASE;  Surgeon: Nicki Reaper, MD;  Location: [facility];  Service: Orthopedics;  Laterality: Left;  . Carpal tunnel release Right 01/12/2013    Procedure: Right Carpal Tunnel Release;  Surgeon: Nicki Reaper, MD;  Location: [facility];  Service: Orthopedics;  Laterality: Right;   No family history on file. History  Substance Use Topics  . Smoking status: Current Every Day Smoker -- 0.50 packs/day for 30 years    Types: Cigarettes  . Smokeless tobacco: Never Used     Comment: 9 cig./day  . Alcohol Use: Yes     Comment: socially   OB History    No data available     Review of Systems  Constitutional: Positive for fever, chills and fatigue. Negative for diaphoresis.  HENT: Positive for sore throat. Negative for sneezing.   Respiratory: Positive for cough. Negative for chest tightness, shortness of breath and wheezing.   Cardiovascular: Negative for chest pain.  Gastrointestinal: Positive for nausea. Negative for vomiting, abdominal pain, diarrhea and constipation.  Musculoskeletal: Positive for myalgias and arthralgias. Negative for joint swelling and neck pain.  Neurological: Positive for headaches. Negative for weakness and light-headedness.  Psychiatric/Behavioral: Negative for confusion.  All other systems reviewed and are negative.     Allergies  Review of patient's allergies indicates no known allergies.  Home Medications   Prior to Admission medications   Medication Sig Start Date End Date Taking? Authorizing Provider  acetaminophen-codeine 120-12 MG/5ML solution Take 10 mLs by mouth every 4 (four) hours as needed for moderate pain.  Patient not taking: Reported on 12/19/2014 12/13/14   Charlestine Nighthristopher Lawyer, PA-C  albuterol (PROVENTIL HFA;VENTOLIN HFA) 108 (90 BASE) MCG/ACT inhaler Inhale 2 puffs into the lungs every 6 (six) hours as needed. For shortness of breath or wheeze     Historical Provider, MD  budesonide-formoterol (SYMBICORT) 160-4.5 MCG/ACT inhaler Inhale 2 puffs into the lungs 3 (three) times daily.     Historical Provider, MD  cholecalciferol (VITAMIN D) 1000 UNITS tablet Take 1,000 Units by mouth daily.    Historical Provider, MD  doxycycline  (VIBRAMYCIN) 100 MG capsule Take 1 capsule (100 mg total) by mouth 2 (two) times daily. Patient not taking: Reported on 12/19/2014 12/13/14   Charlestine Nighthristopher Lawyer, PA-C  guaiFENesin (ROBITUSSIN) 100 MG/5ML liquid Take 5-10 mLs (100-200 mg total) by mouth every 4 (four) hours as needed for cough. 12/19/14   Tatyana Kirichenko, PA-C  HYDROcodone-acetaminophen (NORCO) 5-325 MG per tablet Take 1 tablet by mouth every 6 (six) hours as needed for moderate pain. 12/19/14   Tatyana Kirichenko, PA-C  ibuprofen (ADVIL,MOTRIN) 800 MG tablet Take 800 mg by mouth every 8 (eight) hours as needed for moderate pain.    Historical Provider, MD  levonorgestrel (MIRENA) 20 MCG/24HR IUD 1 each by Intrauterine route once.    Historical Provider, MD  omeprazole (PRILOSEC) 10 MG capsule Take 10 mg by mouth 2 (two) times daily.    Historical Provider, MD  ondansetron (ZOFRAN ODT) 8 MG disintegrating tablet Take 1 tablet (8 mg total) by mouth every 8 (eight) hours as needed for nausea or vomiting. 12/19/14   Jaynie Crumbleatyana Kirichenko, PA-C  predniSONE (DELTASONE) 50 MG tablet Take 1 tablet (50 mg total) by mouth daily. 12/13/14   Charlestine Nighthristopher Lawyer, PA-C  QUEtiapine (SEROQUEL) 25 MG tablet Take 25 mg by mouth at bedtime.    Historical Provider, MD  sucralfate (CARAFATE) 1 G tablet Take 1 tablet (1 g total) by mouth 4 (four) times daily. Patient not taking: Reported on 12/13/2014 11/04/13   Lorre NickAnthony Allen, MD  zolpidem (AMBIEN) 10 MG tablet Take 10 mg by mouth at bedtime as needed for sleep.    Historical Provider, MD   BP 123/78 mmHg  Pulse 95  Temp(Src) 100.6 F (38.1 C) (Oral)  Resp 18  Ht 5\' 11"  (1.803 m)  Wt 224 lb 9.6 oz (101.878 kg)  BMI 31.34 kg/m2  SpO2 98%   Physical Exam  Constitutional: She appears well-developed and well-nourished. She is cooperative. She does not have a sickly appearance. No distress.  HENT:  Head: Normocephalic and atraumatic.  Nose: Nose normal.  Mouth/Throat: Oropharynx is clear and moist. No  oropharyngeal exudate.  Bilateral 3+ tonsillar hypertrophy but no exudate.  Full neck ROM.  + cervical lymphadenopathy  Eyes: EOM are normal. Pupils are equal, round, and reactive to light.  Neck: Normal range of motion. Neck supple.  Cardiovascular: Normal rate, regular rhythm, normal heart sounds and intact distal pulses.   No murmur heard. Pulmonary/Chest: Effort normal and breath sounds normal. No respiratory distress. She has no wheezes. She exhibits no tenderness.  Clear lungs, normal work of breathing, no wheezing, no hypoxia. Nontender to palpation of chest  Abdominal: Soft. There is no tenderness. There is no rebound and no guarding.  Musculoskeletal: Normal range of motion. She exhibits no tenderness.  Lymphadenopathy:    She has cervical adenopathy.  Neurological: She is alert. No cranial nerve deficit. Coordination normal.  Skin: Skin is warm and dry. She is not diaphoretic.  Psychiatric: She has a normal mood  and affect. Her behavior is normal. Judgment and thought content normal.  Nursing note and vitals reviewed.   ED Course  Procedures (including critical care time) Labs Review Labs Reviewed - No data to display Imaging Review No results found.   EKG Interpretation None      MDM   Final diagnoses:  Myalgia  URI (upper respiratory infection)  Fever in adult  Cough   Pt is a 54 yo F with hx of anxiety and seizures who presents complaining of URI sx.  Woke this AM with generalized aches, chills, cough, sore throat, and mild nausea.  Felt ok last night, no known sick contacts, no recent travel.  Has gone out a few times this week but nothing for the past 2 days.  No longer smokes tobacco.  Has a hx of asthma in the past, but denies needing to use her inhaler, no wheezing, nonproductive cough.   Got her flu shot this year.   Well appearing, in NAD.  Temp of 100.6 in triage.  Dry cough heard.  No wheezing, increased work of breathing, or hypoxia.  Bilateral  tonsillar hypertrophy and palpable cervical adenopathy.  No tonsillar exudate, full neck ROM.    Likely viral URI.  No red flag symptoms concerning for cardiac chest pain.  No indication for imaging or labs based on benign exam.  Will treat symptomatically with tylenol, zofran, and magic mouth wash.  Discussed increased PO fluid intake and PRN antipyretics at home.     EKG: NSR at 97 bpm with no signs of ischemia or arrhythmia   Repeat evaluation showed RUQ tenderness to palpation.  Patient was offered blood work and RUQ [place] to evaluate for gallbladder pathology, but she declined.  Still stable vitals and overall benign exam.  Considered stable for dc home still.  Advised on symptomatic care at home.  Discussed alternating motrin and tylenol for pain.  Given Rx for zofran.  Discussed ED return precautions and all questions were answered.    Patient was seen with ED attending, Dr. Suzzette Righter, MD  Lenell Antu, MD 05/02/15 1610  Mirian Mo, MD 05/06/15 [phone]

## 2015-05-02 NOTE — ED Notes (Signed)
Pt. Stated, I woke up at 0200 with chest pain and body aches and SOB.

## 2015-05-02 NOTE — ED Notes (Signed)
Pt. Stated, I've had a tinged in my chest this morning and pain is all over.  I've ad a cold all week and that's probably it.

## 2015-05-02 NOTE — ED Notes (Signed)
Pt placed in gown and in bed. Pt monitored by pulse ox, bp cuff, and 5-lead MD Delford FieldWright at bedside.

## 2015-05-04 DIAGNOSIS — R509 Fever, unspecified: Secondary | ICD-10-CM | POA: Diagnosis not present

## 2015-05-04 DIAGNOSIS — R05 Cough: Secondary | ICD-10-CM | POA: Diagnosis not present

## 2015-05-04 DIAGNOSIS — Z79899 Other long term (current) drug therapy: Secondary | ICD-10-CM | POA: Diagnosis not present

## 2015-05-04 DIAGNOSIS — F329 Major depressive disorder, single episode, unspecified: Secondary | ICD-10-CM | POA: Diagnosis not present

## 2015-05-04 DIAGNOSIS — J029 Acute pharyngitis, unspecified: Secondary | ICD-10-CM | POA: Diagnosis not present

## 2015-05-04 DIAGNOSIS — Z87891 Personal history of nicotine dependence: Secondary | ICD-10-CM | POA: Diagnosis not present

## 2015-06-20 ENCOUNTER — Encounter (HOSPITAL_COMMUNITY): Payer: Self-pay | Admitting: Emergency Medicine

## 2015-06-20 ENCOUNTER — Emergency Department (HOSPITAL_COMMUNITY)
Admission: EM | Admit: 2015-06-20 | Discharge: 2015-06-20 | Disposition: A | Discharge status: Home / Self Care | Payer: Commercial Managed Care - HMO | Attending: Emergency Medicine | Admitting: Emergency Medicine

## 2015-06-20 DIAGNOSIS — G40909 Epilepsy, unspecified, not intractable, without status epilepticus: Secondary | ICD-10-CM | POA: Insufficient documentation

## 2015-06-20 DIAGNOSIS — G4733 Obstructive sleep apnea (adult) (pediatric): Secondary | ICD-10-CM | POA: Insufficient documentation

## 2015-06-20 DIAGNOSIS — Z9981 Dependence on supplemental oxygen: Secondary | ICD-10-CM | POA: Diagnosis not present

## 2015-06-20 DIAGNOSIS — Z79899 Other long term (current) drug therapy: Secondary | ICD-10-CM | POA: Diagnosis not present

## 2015-06-20 DIAGNOSIS — F419 Anxiety disorder, unspecified: Secondary | ICD-10-CM | POA: Insufficient documentation

## 2015-06-20 DIAGNOSIS — M79642 Pain in left hand: Secondary | ICD-10-CM | POA: Diagnosis not present

## 2015-06-20 DIAGNOSIS — J45909 Unspecified asthma, uncomplicated: Secondary | ICD-10-CM | POA: Diagnosis not present

## 2015-06-20 DIAGNOSIS — R202 Paresthesia of skin: Secondary | ICD-10-CM | POA: Diagnosis not present

## 2015-06-20 DIAGNOSIS — Z720 Tobacco use: Secondary | ICD-10-CM | POA: Diagnosis not present

## 2015-06-20 NOTE — Discharge Instructions (Signed)
Please follow up with Dr. Cyril Mourning. You may take your Oxycodone 1-2 tablets every six hours as needed for pain. Please keep your surgical sites clean and dry. Please read all discharge instructions and return precautions.   Pain Relief Preoperatively and Postoperatively Being a good patient does not mean being a silent one.If you have questions, problems, or concerns about the pain you may feel after surgery, let your caregiver know.Patients have the right to assessment and management of pain. The treatment of pain after surgery is important to speed up recovery and return to normal activities. Severe pain after surgery, and the fear or anxiety associated with that pain, may cause extreme discomfort that:  Prevents sleep.  Decreases the ability to breathe deeply and cough. This can cause pneumonia or other upper airway infections.  Causes your heart to beat faster and your blood pressure to be higher.  Increases the risk for constipation and bloating.  Decreases the ability of wounds to heal.  May result in depression, increased anxiety, and feelings of helplessness. Relief of pain before surgery is also important because it will lessen the pain after surgery. Patients who receive both pain relief before and after surgery experience greater pain relief than those who only receive pain relief after surgery. Let your caregiver know if you are having uncontrolled pain.This is very important.Pain after surgery is more difficult to manage if it is permitted to become severe, so prompt and adequate treatment of acute pain is necessary. PAIN CONTROL METHODS Your caregivers follow policies and procedures about the management of patient pain.These guidelines should be explained to you before surgery.Plans for pain control after surgery must be mutually decided upon and instituted with your full understanding and agreement.Do not be afraid to ask questions regarding the care you are receiving.There are  many different ways your caregivers will attempt to control your pain, including the following methods. As needed pain control  You may be given pain medicine either through your intravenous (IV) tube, or as a pill or liquid you can swallow. You will need to let your caregiver know when you are having pain. Then, your caregiver will give you the pain medicine ordered for you.  Your pain medicine may make you constipated. If constipation occurs, drink more liquids if you can. Your caregiver may have you take a mild laxative. IV patient-controlled analgesia pump (PCA pump)  You can get your pain medicine through the IV tube which goes into your vein. You are able to control the amount of pain medicine that you get. The pain medicine flows in through an IV tube and is controlled by a pump. This pump gives you a set amount of pain medicine when you push the button hooked up to it. Nobody should push this button but you or someone specifically assigned by you to do so. It is set up to keep you from accidentally giving yourself too much pain medicine. You will be able to start using your pain pump in the recovery room after your surgery. This method can be helpful for most types of surgery.  If you are still having too much pain, tell your caregiver. Also, tell your caregiver if you are feeling too sleepy or nauseous. Continuous epidural pain control  A thin, soft tube (catheter) is put into your back. Pain medicine flows through the catheter to lessen pain in the part of your body where the surgery is done. Continuous epidural pain control may work best for you if you are having  surgery on your chest, abdomen, hip area, or legs. The epidural catheter is usually put into your back just before surgery. The catheter is left in until you can eat and take medicine by mouth. In most cases, this may take 2 to 3 days.  Giving pain medicine through the epidural catheter may help you heal faster because:  Your  bowel gets back to normal faster.  You can get back to eating sooner.  You can be up and walking sooner. Medicine that numbs the area (local anesthetic)  You may receive an injection of pain medicine near where the pain is (local infiltration).  You may receive an injection of pain medicine near the nerve that controls the sensation to a specific part of the body (peripheral nerve block).  Medicine may be put in the spine to block pain (spinal block). Opioids  Moderate to moderately severe acute pain after surgery may respond to opioids.Opioids are narcotic pain medicine. Opioids are often combined with non-narcotic medicines to improve pain relief, diminish the risk of side effects, and reduce the chance of addiction.  If you follow your caregiver's directions about taking opioids and you do not have a history of substance abuse, your risk of becoming addicted is exceptionally small.Opioids are given for short periods of time in careful doses to prevent addiction. Other methods of pain control include:  Steroids.  Physical therapy.  Heat and cold therapy.  Compression, such as wrapping an elastic bandage around the area of pain.  Massage. These various ways of controlling pain may be used together. Combining different methods of pain control is called multimodal analgesia. Using this approach has many benefits, including being able to eat, move around, and leave the hospital sooner. Document Released: 12/29/2002 Document Revised: 12/31/2011 Document Reviewed: 01/02/2011 [facility] Patient Information 2015 [place], [place]. This information is not intended to replace advice given to you by your health care provider. Make sure you discuss any questions you have with your health care provider.

## 2015-06-20 NOTE — ED Provider Notes (Signed)
CSN: [id number]     Arrival date & time 06/20/15  0756 History   First MD Initiated Contact with Patient 06/20/15 [phone]     Chief Complaint  Patient presents with  . Hand Pain     (Consider location/radiation/quality/duration/timing/severity/associated sxs/prior Treatment) HPI Comments: Patient is a 54 yo Schwartz presenting to the ED for continued left pinky tingling s/p carpal tunnel release and ganglion cyst removal on Wednesday by Dr. Cyril Mourning in [place]. Patient states she has had tingling in her left pinky since the surgery, but feels it may be worse today. She states her pain has been controlled with ibuprofen, ice, and Oxycodone BID. She is due to follow up with Dr. Cyril Mourning on September 6th. She denies any fevers, purulent discharge from surgery site, erythema or warmth to the surgical site. Denies any new injuries since surgery. No modifying factors identified.   Patient is a 54 y.o. Schwartz presenting with hand pain.  Hand Pain    Past Medical History  Diagnosis Date  . Nerve damage     hands  . Plantar fasciitis     bilateral  . Seizures     seizure disorder; last seizure 1 yr. ago  . Headache(784.0)     migraines  . Anxiety   . Mental disorder     anger issues  . Asthma     daily inhalers  . History of foot surgery     2 surgeries on left foot, 3 on right foot  . Carpal tunnel syndrome of left wrist 12/2012  . Sleep apnea     uses CPAP nightly   Past Surgical History  Procedure Laterality Date  . Elbow surgery Left   . Plantar fascia release    . Bunionectomy    . Hammer toe surgery    . Carpal tunnel release Left 12/26/2012    Procedure: CARPAL TUNNEL RELEASE;  Surgeon: Nicki Reaper, MD;  Location: [facility];  Service: Orthopedics;  Laterality: Left;  . Carpal tunnel release Right 01/12/2013    Procedure: Right Carpal Tunnel Release;  Surgeon: Nicki Reaper, MD;  Location: [facility];  Service: Orthopedics;  Laterality: Right;   History  reviewed. No pertinent family history. Social History  Substance Use Topics  . Smoking status: Current Every Day Smoker -- 0.50 packs/day for 30 years    Types: Cigarettes  . Smokeless tobacco: Never Used     Comment: 9 cig./day  . Alcohol Use: Yes     Comment: socially   OB History    No data available     Review of Systems  Musculoskeletal:       + left fifth finger tingling  All other systems reviewed and are negative.     Allergies  Review of patient's allergies indicates no known allergies.  Home Medications   Prior to Admission medications   Medication Sig Start Date End Date Taking? Authorizing Provider  acetaminophen-codeine 120-12 MG/5ML solution Take 10 mLs by mouth every 4 (four) hours as needed for moderate pain. Patient not taking: Reported on 12/19/2014 12/13/14   Charlestine Night, PA-C  albuterol (PROVENTIL HFA;VENTOLIN HFA) 108 (90 BASE) MCG/ACT inhaler Inhale 2 puffs into the lungs every 6 (six) hours as needed. For shortness of breath or wheeze     Historical Provider, MD  budesonide-formoterol (SYMBICORT) 160-4.5 MCG/ACT inhaler Inhale 2 puffs into the lungs 3 (three) times daily.     Historical Provider, MD  cholecalciferol (VITAMIN D) 1000 UNITS  tablet Take 1,000 Units by mouth daily.    Historical Provider, MD  doxycycline (VIBRAMYCIN) 100 MG capsule Take 1 capsule (100 mg total) by mouth 2 (two) times daily. Patient not taking: Reported on 12/19/2014 12/13/14   Charlestine Night, PA-C  guaiFENesin (ROBITUSSIN) 100 MG/5ML liquid Take 5-10 mLs (100-200 mg total) by mouth every 4 (four) hours as needed for cough. Patient not taking: Reported on 05/02/2015 12/19/14   Jaynie Crumble, PA-C  HYDROcodone-acetaminophen (NORCO) 5-325 MG per tablet Take 1 tablet by mouth every 6 (six) hours as needed for moderate pain. Patient not taking: Reported on 05/02/2015 12/19/14   Tatyana Kirichenko, PA-C  ibuprofen (ADVIL,MOTRIN) 800 MG tablet Take 800 mg by mouth every 8  (eight) hours as needed for moderate pain.    Historical Provider, MD  levonorgestrel (MIRENA) 20 MCG/24HR IUD 1 each by Intrauterine route once.    Historical Provider, MD  omeprazole (PRILOSEC) 10 MG capsule Take 10 mg by mouth 2 (two) times daily.    Historical Provider, MD  ondansetron (ZOFRAN-ODT) 4 MG disintegrating tablet Take 1 tablet (4 mg total) by mouth every 8 (eight) hours as needed for nausea or vomiting. 05/02/15   Lenell Antu, MD  predniSONE (DELTASONE) 50 MG tablet Take 1 tablet (50 mg total) by mouth daily. Patient not taking: Reported on 05/02/2015 12/13/14   Charlestine Night, PA-C  QUEtiapine (SEROQUEL) 25 MG tablet Take 25 mg by mouth at bedtime.    Historical Provider, MD  sucralfate (CARAFATE) 1 G tablet Take 1 tablet (1 g total) by mouth 4 (four) times daily. Patient not taking: Reported on 12/13/2014 11/04/13   Lorre Nick, MD  zolpidem (AMBIEN) 10 MG tablet Take 10 mg by mouth at bedtime as needed for sleep.    Historical Provider, MD   BP 110/89 mmHg  Pulse 104  Temp(Src) 98.9 Schwartz (37.2 C) (Oral)  Resp 17  Ht 5\' 11"  (1.803 m)  Wt 212 lb (96.163 kg)  BMI 29.58 kg/m2  SpO2 100% Physical Exam  Constitutional: She is oriented to person, place, and time. She appears well-developed and well-nourished. No distress.  HENT:  Head: Normocephalic and atraumatic.  Right Ear: External ear normal.  Left Ear: External ear normal.  Nose: Nose normal.  Mouth/Throat: Oropharynx is clear and moist.  Eyes: Conjunctivae are normal.  Neck: Normal range of motion. Neck supple.  No nuchal rigidity.   Cardiovascular: Normal rate, regular rhythm, normal heart sounds and intact distal pulses.   Cap Refill < 3 sec  Pulmonary/Chest: Effort normal and breath sounds normal.  Abdominal: Soft.  Musculoskeletal: Normal range of motion.  Neurological: She is alert and oriented to person, place, and time.  Subjective diminished sensation to left fifth finger.  Skin: Skin is warm and dry.  She is not diaphoretic.  Three left hand palmer surgical sites noted. All CDI. No drainage. No warmth or erythema.   Psychiatric: She has a normal mood and affect.  Nursing note and vitals reviewed.   ED Course  Procedures (including critical care time) Medications - No data to display  Labs Review Labs Reviewed - No data to display  Imaging Review No results found. I have personally reviewed and evaluated these images and lab results as part of my medical decision-making.   EKG Interpretation None      MDM   Final diagnoses:  Left hand pain    Filed Vitals:   06/20/15 0801  BP: 110/89  Pulse: 104  Temp: 98.9 Schwartz (37.2 C)  Resp: 17   Afebrile, NAD, non-toxic appearing, AAOx4.   Neurovascularly intact. Sensation grossly intact. No evidence of compartment syndrome.  Surgical incision sites are clean, dry, and intact. No evidence of infection.   Subjective decreased sensation has been present since surgery on Wednesday, likely secondary to inflammation. Will discharge home with advised hand surgery follow up with Dr. Cyril Mourning.   Discussed pain medication dosing schedule.   Return precautions discussed. Patient is agreeable to plan. Patient is stable at time of discharge    Francee Piccolo, PA-C 06/20/15 1030  Mancel Bale, MD 06/20/15 [phone]

## 2015-06-20 NOTE — ED Notes (Signed)
Dr. Cyril Mourning in [place] performed hand surgery on right hand; icing and taking ibuprofen; oxycodone. Felt tingling and numbness so loosened dressing. Did not help. Outer small digit tingling. States she thinks may be getting worse since surgery. Follow up on Sept 6th. Changed dressing and reports no abnormalities.

## 2015-06-20 NOTE — ED Notes (Signed)
PT ambulated with baseline gait; VSS; A&Ox3; no signs of distress; respirations even and unlabored; skin warm and dry; no questions upon discharge.  

## 2015-06-20 NOTE — ED Notes (Addendum)
PA at bedside removing dressing. Suture line approximated and no discharge or signs of infection.

## 2015-08-04 DIAGNOSIS — N202 Calculus of kidney with calculus of ureter: Secondary | ICD-10-CM | POA: Diagnosis not present

## 2015-09-19 DIAGNOSIS — Z79899 Other long term (current) drug therapy: Secondary | ICD-10-CM | POA: Diagnosis not present

## 2015-09-19 DIAGNOSIS — Z87891 Personal history of nicotine dependence: Secondary | ICD-10-CM | POA: Diagnosis not present

## 2015-09-19 DIAGNOSIS — F419 Anxiety disorder, unspecified: Secondary | ICD-10-CM | POA: Diagnosis not present

## 2015-09-19 DIAGNOSIS — J069 Acute upper respiratory infection, unspecified: Secondary | ICD-10-CM | POA: Diagnosis not present

## 2015-09-19 DIAGNOSIS — R05 Cough: Secondary | ICD-10-CM | POA: Diagnosis not present

## 2015-09-19 DIAGNOSIS — F329 Major depressive disorder, single episode, unspecified: Secondary | ICD-10-CM | POA: Diagnosis not present

## 2015-09-19 DIAGNOSIS — G4730 Sleep apnea, unspecified: Secondary | ICD-10-CM | POA: Diagnosis not present

## 2015-11-04 ENCOUNTER — Ambulatory Visit: Payer: Commercial Managed Care - HMO | Admitting: Internal Medicine

## 2016-01-25 DIAGNOSIS — M722 Plantar fascial fibromatosis: Secondary | ICD-10-CM | POA: Diagnosis not present

## 2016-01-25 DIAGNOSIS — Z79899 Other long term (current) drug therapy: Secondary | ICD-10-CM | POA: Diagnosis not present

## 2016-01-25 DIAGNOSIS — J029 Acute pharyngitis, unspecified: Secondary | ICD-10-CM | POA: Diagnosis not present

## 2016-01-25 DIAGNOSIS — G4730 Sleep apnea, unspecified: Secondary | ICD-10-CM | POA: Diagnosis not present

## 2016-01-25 DIAGNOSIS — R509 Fever, unspecified: Secondary | ICD-10-CM | POA: Diagnosis not present

## 2016-01-25 DIAGNOSIS — F329 Major depressive disorder, single episode, unspecified: Secondary | ICD-10-CM | POA: Diagnosis not present

## 2016-01-25 DIAGNOSIS — J45909 Unspecified asthma, uncomplicated: Secondary | ICD-10-CM | POA: Diagnosis not present

## 2016-01-25 DIAGNOSIS — J3489 Other specified disorders of nose and nasal sinuses: Secondary | ICD-10-CM | POA: Diagnosis not present

## 2016-01-25 DIAGNOSIS — R05 Cough: Secondary | ICD-10-CM | POA: Diagnosis not present

## 2016-01-25 DIAGNOSIS — F17210 Nicotine dependence, cigarettes, uncomplicated: Secondary | ICD-10-CM | POA: Diagnosis not present

## 2016-01-25 DIAGNOSIS — F419 Anxiety disorder, unspecified: Secondary | ICD-10-CM | POA: Diagnosis not present

## 2016-01-25 DIAGNOSIS — G5600 Carpal tunnel syndrome, unspecified upper limb: Secondary | ICD-10-CM | POA: Diagnosis not present

## 2016-11-29 IMAGING — DX DG CHEST 2V
2 series · 2 of 2 positions shown · non-contrast
Comparison: 11/04/2013 and earlier.

CLINICAL DATA: 53-year-old female with fever cough and congestion
for 3-7 days. Initial encounter.

EXAM:
CHEST  2 VIEW

[chest pa]
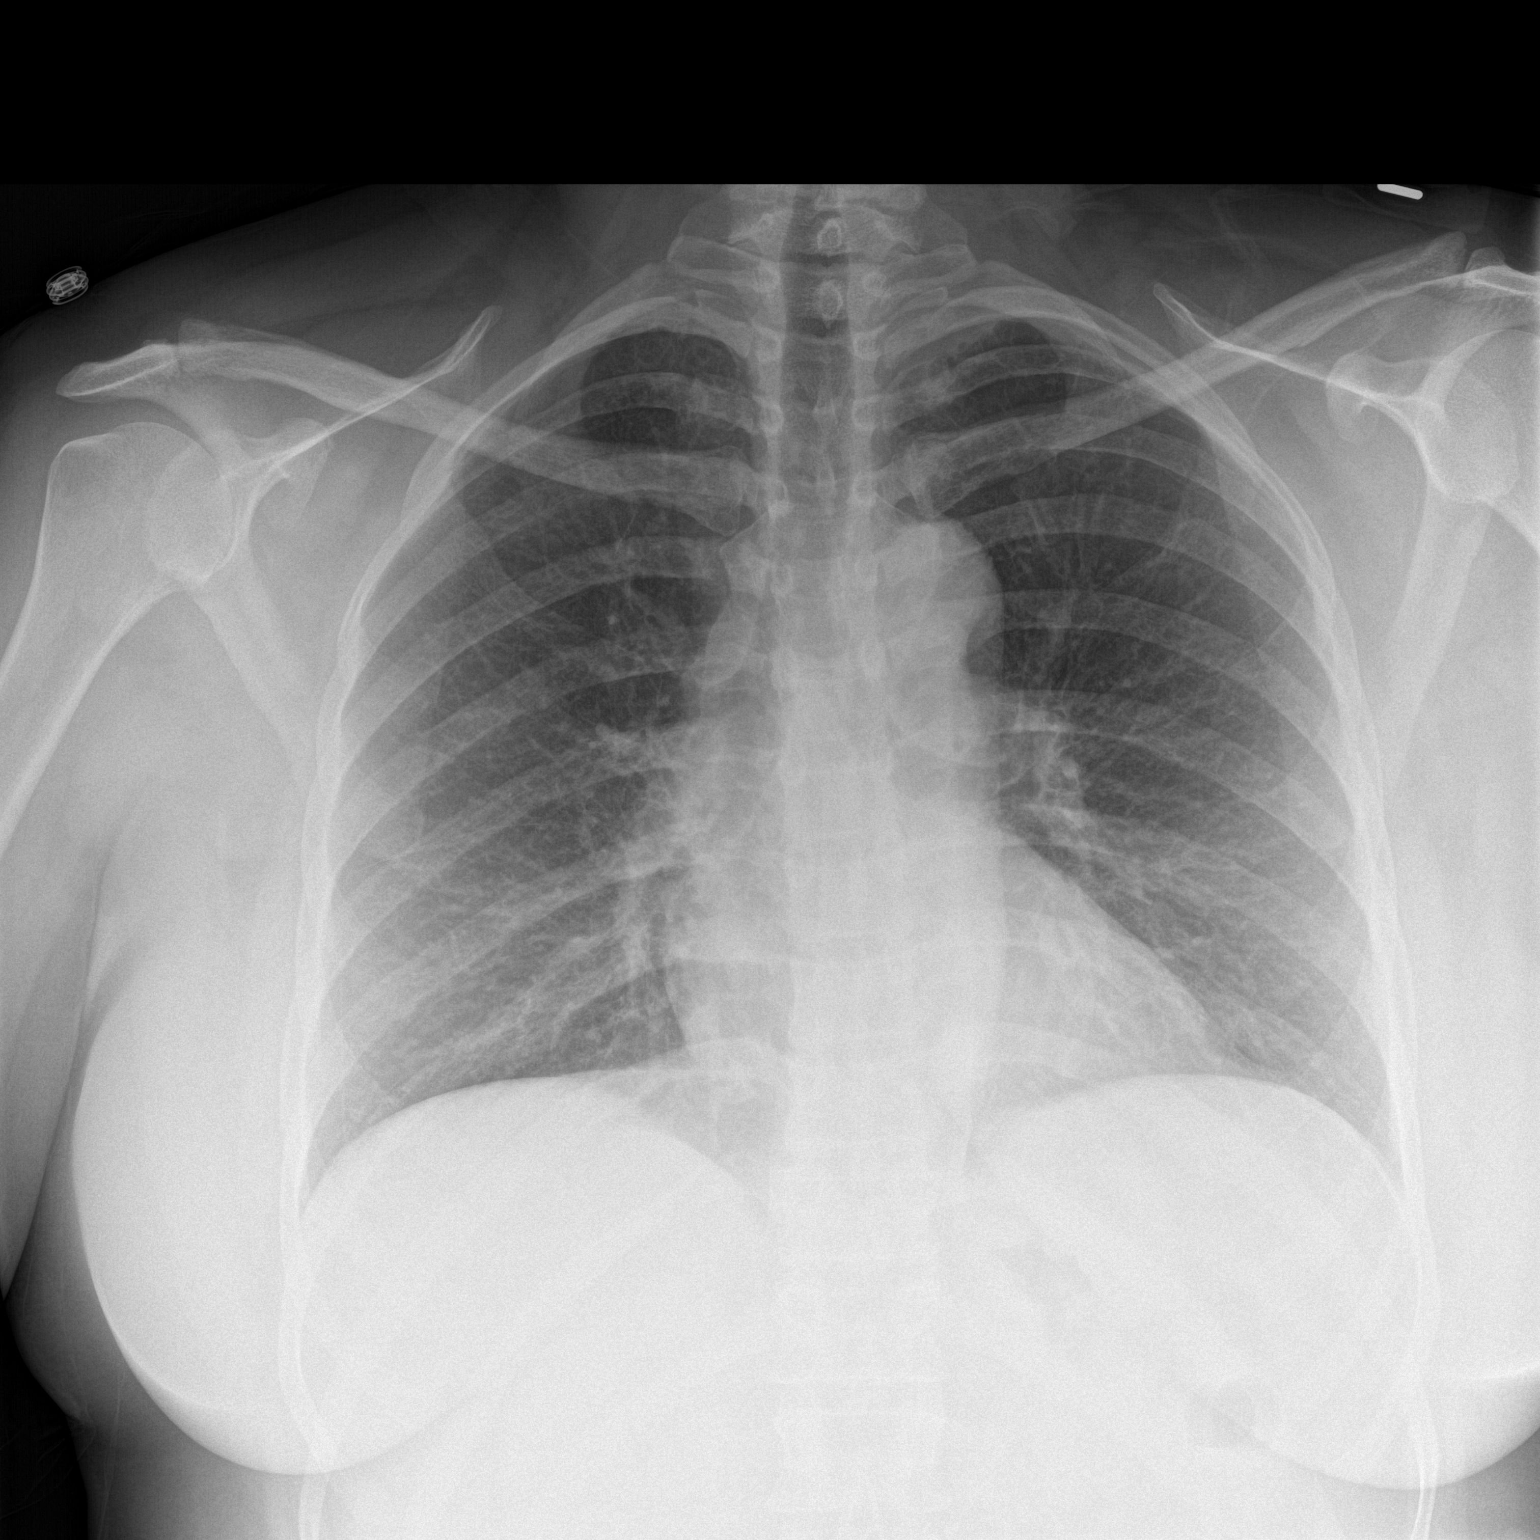

[chest lat]
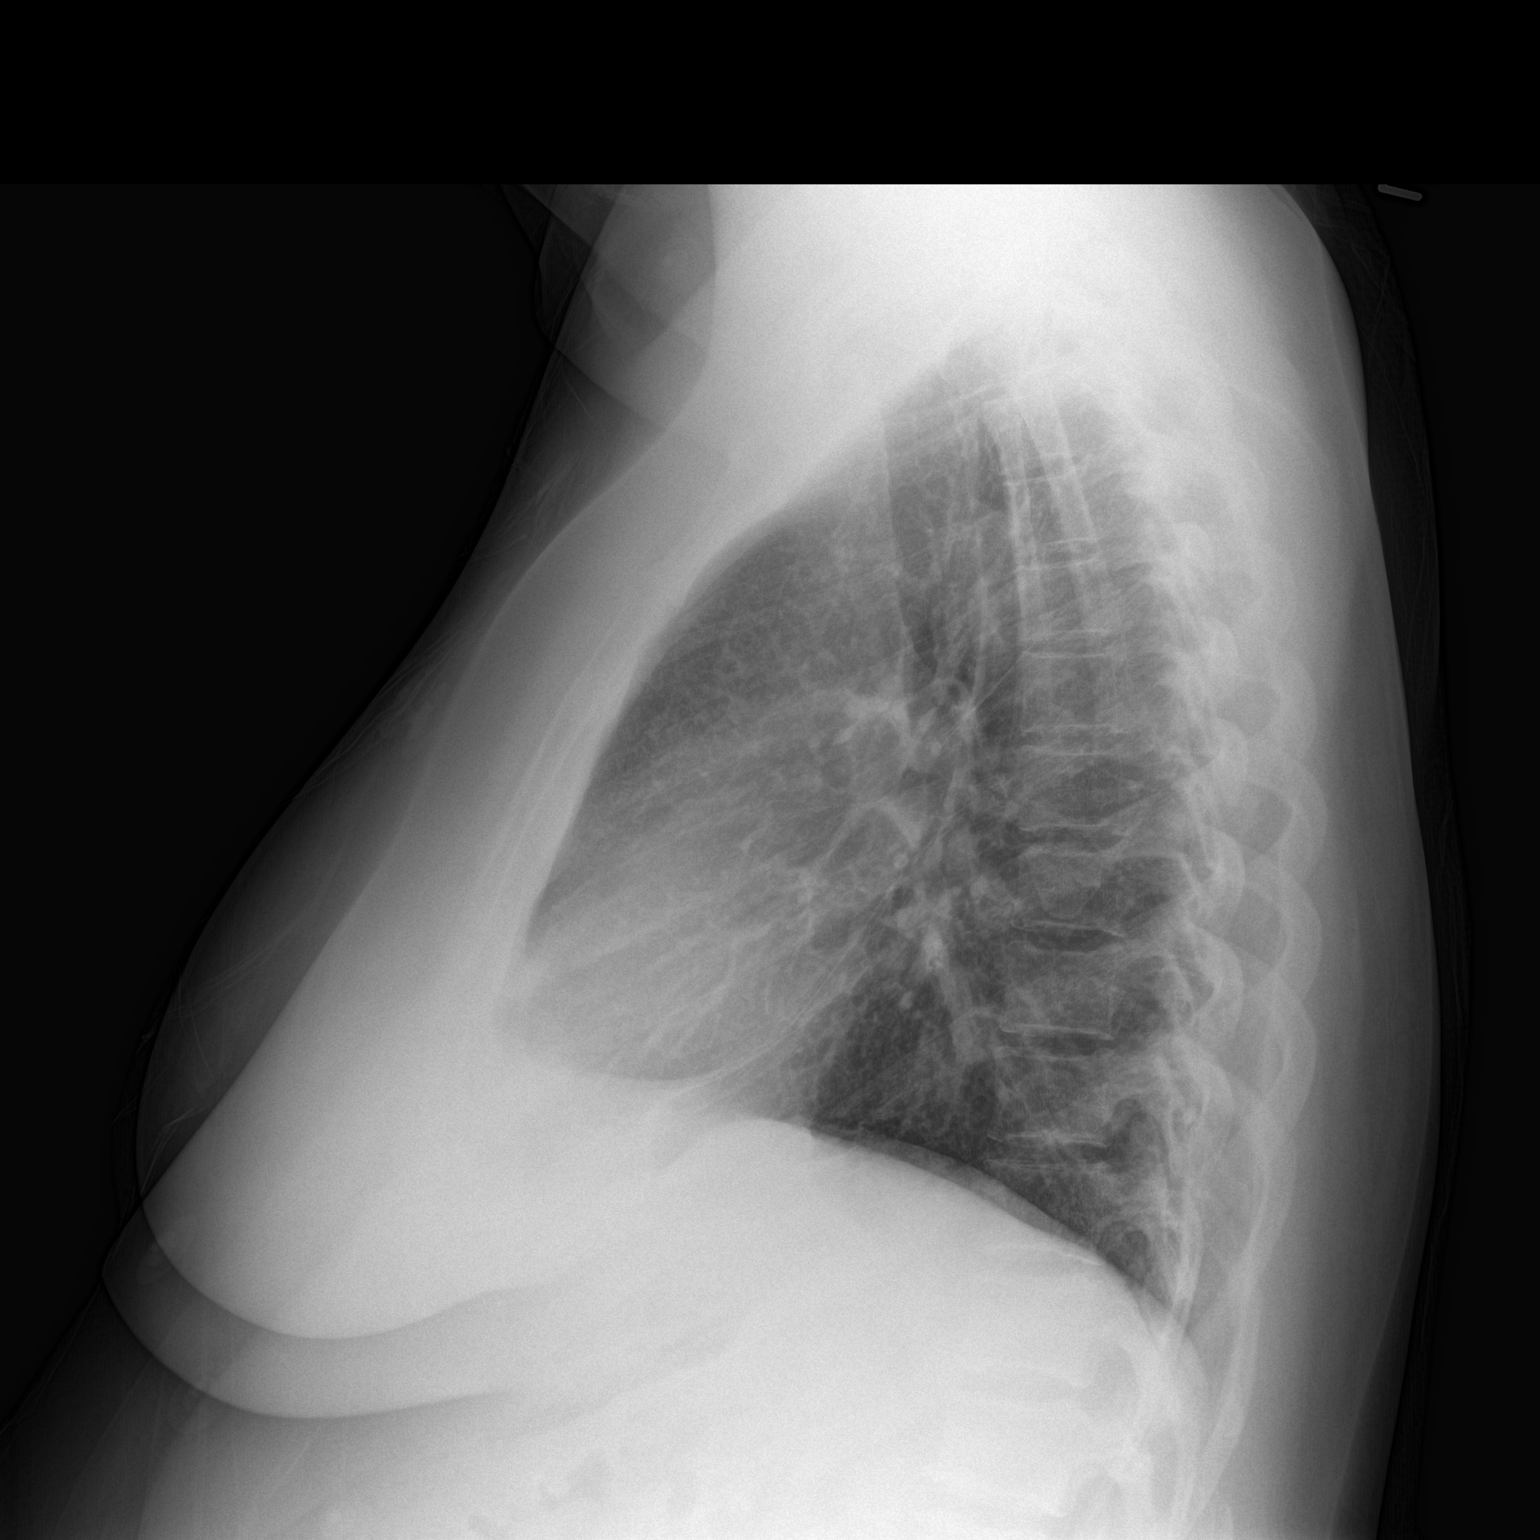

[2 of 2 positions shown; findings below may reference images not displayed]

FINDINGS: Mildly increased interstitial markings in both lungs appear not
significantly changed since 8551. No pneumothorax, pulmonary edema,
pleural effusion or confluent pulmonary opacity. Normal cardiac size
and mediastinal contours. Visualized tracheal air column is within
normal limits. No acute osseous abnormality identified.
IMPRESSION: Mild increased interstitial markings appear chronic, but
viral/atypical respiratory infection is difficult to exclude in this
setting.

Otherwise no acute cardiopulmonary abnormality.

## 2017-05-19 ENCOUNTER — Encounter (HOSPITAL_COMMUNITY): Payer: Self-pay | Admitting: Emergency Medicine

## 2017-05-19 ENCOUNTER — Emergency Department (HOSPITAL_COMMUNITY): Payer: Medicare HMO

## 2017-05-19 ENCOUNTER — Emergency Department (HOSPITAL_COMMUNITY)
Admission: EM | Admit: 2017-05-19 | Discharge: 2017-05-19 | Disposition: A | Discharge status: Home / Self Care | Payer: Medicare HMO | Attending: Emergency Medicine | Admitting: Emergency Medicine

## 2017-05-19 DIAGNOSIS — R079 Chest pain, unspecified: Secondary | ICD-10-CM | POA: Diagnosis not present

## 2017-05-19 DIAGNOSIS — R197 Diarrhea, unspecified: Secondary | ICD-10-CM | POA: Insufficient documentation

## 2017-05-19 DIAGNOSIS — R0602 Shortness of breath: Secondary | ICD-10-CM | POA: Insufficient documentation

## 2017-05-19 DIAGNOSIS — J45909 Unspecified asthma, uncomplicated: Secondary | ICD-10-CM | POA: Diagnosis not present

## 2017-05-19 DIAGNOSIS — R51 Headache: Secondary | ICD-10-CM | POA: Insufficient documentation

## 2017-05-19 DIAGNOSIS — F17210 Nicotine dependence, cigarettes, uncomplicated: Secondary | ICD-10-CM | POA: Insufficient documentation

## 2017-05-19 DIAGNOSIS — R112 Nausea with vomiting, unspecified: Secondary | ICD-10-CM | POA: Insufficient documentation

## 2017-05-19 DIAGNOSIS — R05 Cough: Secondary | ICD-10-CM | POA: Diagnosis not present

## 2017-05-19 DIAGNOSIS — J0300 Acute streptococcal tonsillitis, unspecified: Secondary | ICD-10-CM | POA: Insufficient documentation

## 2017-05-19 DIAGNOSIS — J029 Acute pharyngitis, unspecified: Secondary | ICD-10-CM | POA: Diagnosis present

## 2017-05-19 DIAGNOSIS — Z79899 Other long term (current) drug therapy: Secondary | ICD-10-CM | POA: Diagnosis not present

## 2017-05-19 LAB — BASIC METABOLIC PANEL
ANION GAP: 10 (ref 5–15)
BUN: 10 mg/dL (ref 6–20)
CALCIUM: 9.2 mg/dL (ref 8.9–10.3)
CO2: 24 mmol/L (ref 22–32)
CREATININE: 1.08 mg/dL — AB (ref 0.44–1.00)
Chloride: 99 mmol/L — ABNORMAL LOW (ref 101–111)
GFR calc non Af Amer: 57 mL/min — ABNORMAL LOW (ref >60)
Glucose, Bld: 116 mg/dL — ABNORMAL HIGH (ref 65–99)
Potassium: 3.9 mmol/L (ref 3.5–5.1)
SODIUM: 133 mmol/L — AB (ref 135–145)

## 2017-05-19 LAB — CBC
HEMATOCRIT: 40.2 % (ref 36.0–46.0)
Hemoglobin: 13.4 g/dL (ref 12.0–15.0)
MCH: 30.2 pg (ref 26.0–34.0)
MCHC: 33.3 g/dL (ref 30.0–36.0)
MCV: 90.5 fL (ref 78.0–100.0)
Platelets: 310 10*3/uL (ref 150–400)
RBC: 4.44 MIL/uL (ref 3.87–5.11)
RDW: 14 % (ref 11.5–15.5)
WBC: 25.9 10*3/uL — AB (ref 4.0–10.5)

## 2017-05-19 LAB — I-STAT TROPONIN, ED: Troponin i, poc: 0 ng/mL (ref 0.00–0.08)

## 2017-05-19 LAB — RAPID STREP SCREEN (MED CTR MEBANE ONLY): STREPTOCOCCUS, GROUP A SCREEN (DIRECT): POSITIVE — AB

## 2017-05-19 MED ORDER — HYDROCODONE-ACETAMINOPHEN 7.5-325 MG/15ML PO SOLN
15.0000 mL | Freq: Four times a day (QID) | ORAL | 0 refills | Status: AC | PRN
Start: 2017-05-19 — End: ?

## 2017-05-19 MED ORDER — DEXAMETHASONE SODIUM PHOSPHATE 10 MG/ML IJ SOLN
10.0000 mg | Freq: Once | INTRAMUSCULAR | Status: AC
  Administered 2017-05-19: 10 mg via INTRAVENOUS
  Filled 2017-05-19: qty 1

## 2017-05-19 MED ORDER — SODIUM CHLORIDE 0.9 % IV BOLUS (SEPSIS)
1000.0000 mL | Freq: Once | INTRAVENOUS | Status: AC
  Administered 2017-05-19: 1000 mL via INTRAVENOUS

## 2017-05-19 MED ORDER — PENICILLIN G BENZATHINE 1200000 UNIT/2ML IM SUSP
1.2000 10*6.[IU] | Freq: Once | INTRAMUSCULAR | Status: AC
  Administered 2017-05-19: 1.2 10*6.[IU] via INTRAMUSCULAR
  Filled 2017-05-19: qty 2

## 2017-05-19 NOTE — ED Provider Notes (Signed)
[facility]-EMERGENCY DEPT Provider Note   CSN: [id number] Arrival date & time: 05/19/17  0403     History   Chief Complaint Chief Complaint  Patient presents with  . multiple complaints  . Generalized Body Aches  . Sore Throat  . Chest Pain  . Shortness of Breath  . Asthma  . Headache    HPI Alyssa Schwartz is a 56 y.o. female.  Patient presents to the emergency department for evaluation of flulike symptoms. She has been sick for 2 days. Patient reports that she has had headache, worse with cough, severe sore throat, especially when swallowing, cough, chest congestion, shortness of breath, nausea, vomiting, diarrhea.      Past Medical History:  Diagnosis Date  . Anxiety   . Asthma    daily inhalers  . Carpal tunnel syndrome of left wrist 12/2012  . Headache(784.0)    migraines  . History of foot surgery    2 surgeries on left foot, 3 on right foot  . Mental disorder    anger issues  . Nerve damage    hands  . Plantar fasciitis    bilateral  . Seizures (HCC)    seizure disorder; last seizure 1 yr. ago  . Sleep apnea    uses CPAP nightly    There are no active problems to display for this patient.   Past Surgical History:  Procedure Laterality Date  . BUNIONECTOMY    . CARPAL TUNNEL RELEASE Left 12/26/2012   Procedure: CARPAL TUNNEL RELEASE;  Surgeon: Nicki ReaperGary R Kuzma, MD;  Location: [facility];  Service: Orthopedics;  Laterality: Left;  . CARPAL TUNNEL RELEASE Right 01/12/2013   Procedure: Right Carpal Tunnel Release;  Surgeon: Nicki ReaperGary R Kuzma, MD;  Location: [facility];  Service: Orthopedics;  Laterality: Right;  . ELBOW SURGERY Left   . HAMMER TOE SURGERY    . PLANTAR FASCIA RELEASE      OB History    No data available       Home Medications    Prior to Admission medications   Medication Sig Start Date End Date Taking? Authorizing Provider  albuterol (PROVENTIL HFA;VENTOLIN HFA) 108 (90 BASE) MCG/ACT inhaler Inhale 2 puffs into the  lungs every 6 (six) hours as needed. For shortness of breath or wheeze    Yes [provider]  budesonide-formoterol (SYMBICORT) 160-4.5 MCG/ACT inhaler Inhale 2 puffs into the lungs 3 (three) times daily.    Yes [provider]  cholecalciferol (VITAMIN D) 1000 UNITS tablet Take 1,000 Units by mouth daily.   Yes [provider]  levonorgestrel (MIRENA) 20 MCG/24HR IUD 1 each by Intrauterine route once.   Yes [provider]  omeprazole (PRILOSEC) 10 MG capsule Take 10 mg by mouth 2 (two) times daily.   Yes [provider]  QUEtiapine (SEROQUEL) 25 MG tablet Take 25 mg by mouth at bedtime.   Yes [provider]  zolpidem (AMBIEN) 10 MG tablet Take 10 mg by mouth at bedtime as needed for sleep.   Yes [provider]  guaiFENesin (ROBITUSSIN) 100 MG/5ML liquid Take 5-10 mLs (100-200 mg total) by mouth every 4 (four) hours as needed for cough. Patient not taking: Reported on 05/02/2015 12/19/14   Jaynie CrumbleKirichenko, Tatyana, PA-C  HYDROcodone-acetaminophen (HYCET) 7.5-325 mg/15 ml solution Take 15 mLs by mouth every 6 (six) hours as needed for moderate pain. 05/19/17   Gilda CreasePollina, Kateryn Marasigan J, MD  ondansetron (ZOFRAN-ODT) 4 MG disintegrating tablet Take 1 tablet (4 mg  total) by mouth every 8 (eight) hours as needed for nausea or vomiting. Patient not taking: Reported on 05/19/2017 05/02/15   Lenell AntuWright, Jamie, MD    Family History History reviewed. No pertinent family history.  Social History Social History  Substance Use Topics  . Smoking status: Current Every Day Smoker    Packs/day: 0.50    Years: 30.00    Types: Cigarettes  . Smokeless tobacco: Never Used     Comment: 9 cig./day  . Alcohol use Yes     Comment: socially     Allergies   Patient has no known allergies.   Review of Systems Review of Systems  HENT: Positive for sore throat.   Respiratory: Positive for cough.   Gastrointestinal: Positive for diarrhea, nausea and vomiting.   Neurological: Positive for headaches.  All other systems reviewed and are negative.    Physical Exam Updated Vital Signs BP 102/66   Pulse 92   Temp 98.6 F (37 C) (Oral)   Resp 20   Ht 5\' 11"  (1.803 m)   Wt 102.1 kg (225 lb)   SpO2 93%   BMI 31.38 kg/m   Physical Exam  Constitutional: She is oriented to person, place, and time. She appears well-developed and well-nourished. No distress.  HENT:  Head: Normocephalic and atraumatic.  Right Ear: Hearing normal.  Left Ear: Hearing normal.  Nose: Nose normal.  Mouth/Throat: Mucous membranes are normal. Posterior oropharyngeal erythema present. No tonsillar abscesses. Tonsils are 3+ on the right. Tonsils are 3+ on the left. Tonsillar exudate.  Eyes: Pupils are equal, round, and reactive to light. Conjunctivae and EOM are normal.  Neck: Normal range of motion. Neck supple.  Cardiovascular: Regular rhythm, S1 normal and S2 normal.  Exam reveals no gallop and no friction rub.   No murmur heard. Pulmonary/Chest: Effort normal. No respiratory distress. She has decreased breath sounds. She has wheezes. She exhibits no tenderness.  Abdominal: Soft. Normal appearance and bowel sounds are normal. There is no hepatosplenomegaly. There is no tenderness. There is no rebound, no guarding, no tenderness at McBurney's point and negative Murphy's sign. No hernia.  Musculoskeletal: Normal range of motion.  Neurological: She is alert and oriented to person, place, and time. She has normal strength. No cranial nerve deficit or sensory deficit. Coordination normal. GCS eye subscore is 4. GCS verbal subscore is 5. GCS motor subscore is 6.  Skin: Skin is warm, dry and intact. No rash noted. No cyanosis.  Psychiatric: She has a normal mood and affect. Her speech is normal and behavior is normal. Thought content normal.  Nursing note and vitals reviewed.    ED Treatments / Results  Labs (all labs ordered are listed, but only abnormal results are  displayed) Labs Reviewed  RAPID STREP SCREEN (NOT AT [facility]) - Abnormal; Notable for the following:       Result Value   Streptococcus, Group A Screen (Direct) POSITIVE (*)    All other components within normal limits  BASIC METABOLIC PANEL - Abnormal; Notable for the following:    Sodium 133 (*)    Chloride 99 (*)    Glucose, Bld 116 (*)    Creatinine, Ser 1.08 (*)    GFR calc non Af Amer 57 (*)    All other components within normal limits  CBC - Abnormal; Notable for the following:    WBC 25.9 (*)    All other components within normal limits  I-STAT TROPONIN, ED    EKG  EKG Interpretation  None       Radiology Dg Chest 2 View  Result Date: 05/19/2017 CLINICAL DATA:  Chest pain and dyspnea for 1 day EXAM: CHEST  2 VIEW COMPARISON:  None. FINDINGS: The lungs are clear. The pulmonary vasculature is normal. Heart size is normal. Hilar and mediastinal contours are unremarkable. There is no pleural effusion. IMPRESSION: No active cardiopulmonary disease. Electronically Signed   By: Ellery Plunk M.D.   On: 05/19/2017 05:16    Procedures Procedures (including critical care time)  Medications Ordered in ED Medications  sodium chloride 0.9 % bolus 1,000 mL (1,000 mLs Intravenous New Bag/Given 05/19/17 0522)  dexamethasone (DECADRON) injection 10 mg (10 mg Intravenous Given 05/19/17 0520)  penicillin g benzathine (BICILLIN LA) 1200000 UNIT/2ML injection 1.2 Million Units (1.2 Million Units Intramuscular Given 05/19/17 4098)     Initial Impression / Assessment and Plan / ED Course  I have reviewed the triage vital signs and the nursing notes.  Pertinent labs & imaging results that were available during my care of the patient were reviewed by me and considered in my medical decision making (see chart for details).     Patient presents to the emergency department for evaluation of sore throat, headache, cough, chest congestion, nausea, vomiting, diarrhea. Examination reveals  significant tonsillar enlargement with bilateral exudate. Her strep test was positive. There is no evidence of peritonsillar abscess. Patient was administered Solu-Medrol and IV fluids. She was given Bicillin L-A for treatment of strep pharyngitis. She'll be discharged with analgesia.  Final Clinical Impressions(s) / ED Diagnoses   Final diagnoses:  Strep tonsillitis    New Prescriptions New Prescriptions   HYDROCODONE-ACETAMINOPHEN (HYCET) 7.5-325 MG/15 ML SOLUTION    Take 15 mLs by mouth every 6 (six) hours as needed for moderate pain.     Gilda Crease, MD 05/19/17 [phone]

## 2017-05-19 NOTE — ED Triage Notes (Signed)
Pt presents with multiple complaints beginning with body aches and shortness of breath and CP with cough and headache that is worse with cough; also sore throat worse on left;  pt has some expiratory wheezes; BP low in triage

## 2017-07-29 ENCOUNTER — Other Ambulatory Visit (HOSPITAL_BASED_OUTPATIENT_CLINIC_OR_DEPARTMENT_OTHER): Payer: Self-pay

## 2017-07-29 DIAGNOSIS — G4709 Other insomnia: Secondary | ICD-10-CM

## 2017-08-06 ENCOUNTER — Ambulatory Visit (HOSPITAL_BASED_OUTPATIENT_CLINIC_OR_DEPARTMENT_OTHER): Payer: Medicare HMO | Attending: Internal Medicine | Admitting: Internal Medicine

## 2017-08-06 VITALS — Ht 71.0 in | Wt 220.0 lb

## 2017-08-06 DIAGNOSIS — Z79899 Other long term (current) drug therapy: Secondary | ICD-10-CM | POA: Insufficient documentation

## 2017-08-06 DIAGNOSIS — G4733 Obstructive sleep apnea (adult) (pediatric): Secondary | ICD-10-CM | POA: Diagnosis not present

## 2017-08-06 DIAGNOSIS — G4761 Periodic limb movement disorder: Secondary | ICD-10-CM | POA: Insufficient documentation

## 2017-08-06 DIAGNOSIS — E669 Obesity, unspecified: Secondary | ICD-10-CM | POA: Insufficient documentation

## 2017-08-06 DIAGNOSIS — G4700 Insomnia, unspecified: Secondary | ICD-10-CM | POA: Diagnosis present

## 2017-08-06 DIAGNOSIS — I493 Ventricular premature depolarization: Secondary | ICD-10-CM | POA: Diagnosis not present

## 2017-08-06 DIAGNOSIS — G4709 Other insomnia: Secondary | ICD-10-CM

## 2017-08-06 DIAGNOSIS — Z6831 Body mass index (BMI) 31.0-31.9, adult: Secondary | ICD-10-CM | POA: Diagnosis not present

## 2017-08-18 DIAGNOSIS — G4733 Obstructive sleep apnea (adult) (pediatric): Secondary | ICD-10-CM | POA: Diagnosis not present

## 2017-08-18 NOTE — Procedures (Signed)
Patient Name: Alyssa Schwartz, Laba Date: 08/06/2017 Gender: Female D.O.B: 08-13-1961 Age (years): 12 Referring Provider: Rosanne Gutting MD Height (inches): 71 Interpreting Physician: Baird Lyons MD, ABSM Weight (lbs): 220 RPSGT: Carolin Coy BMI: 31 MRN: [id number] Neck Size: 14.50 CLINICAL INFORMATION Sleep Study Type: Split Night CPAP  Indication for sleep study: Fatigue, Obesity, Snoring  Epworth Sleepiness Score:    3  SLEEP STUDY TECHNIQUE As per the AASM Manual for the Scoring of Sleep and Associated Events v2.3 (April 2016) with a hypopnea requiring 4% desaturations.  The channels recorded and monitored were frontal, central and occipital EEG, electrooculogram (EOG), submentalis EMG (chin), nasal and oral airflow, thoracic and abdominal wall motion, anterior tibialis EMG, snore microphone, electrocardiogram, and pulse oximetry. Continuous positive airway pressure (CPAP) was initiated when the patient met split night criteria and was titrated according to treat sleep-disordered breathing.  MEDICATIONS Medications self-administered by patient taken the night of the study : SEROQUEL, AMBIEN, AMITRIPTYLINE HCL  RESPIRATORY PARAMETERS Diagnostic  Total AHI (/hr): 14.5 RDI (/hr): 16.4 OA Index (/hr): 3.5 CA Index (/hr): 1.9 REM AHI (/hr): 88.0 NREM AHI (/hr): 11.4 Supine AHI (/hr): N/A Non-supine AHI (/hr): 14.51 Min O2 Sat (%): 87.00 Mean O2 (%): 94.36 Time below 88% (min): 0.2   Titration  Optimal Pressure (cm): 14 AHI at Optimal Pressure (/hr): 1.7 Min O2 at Optimal Pressure (%): 95.0 Supine % at Optimal (%): 7 Sleep % at Optimal (%): 92    SLEEP ARCHITECTURE The recording time for the entire night was 411.3 minutes.  During a baseline period of 254.7 minutes, the patient slept for 186.0 minutes in REM and nonREM, yielding a sleep efficiency of 73.0%. Sleep onset after lights out was 26.1 minutes with a REM latency of 174.0 minutes. The patient spent 12.63% of the  night in stage N1 sleep, 83.34% in stage N2 sleep, 0.00% in stage N3 and 4.03% in REM.  During the titration period of 152.5 minutes, the patient slept for 136.2 minutes in REM and nonREM, yielding a sleep efficiency of 89.3%. Sleep onset after CPAP initiation was 3.4 minutes with a REM latency of 43.5 minutes. The patient spent 11.02% of the night in stage N1 sleep, 58.88% in stage N2 sleep, 0.00% in stage N3 and 30.11% in REM.  CARDIAC DATA The 2 lead EKG demonstrated sinus rhythm. The mean heart rate was 89.26 beats per minute. Other EKG findings include: PVCs.  LEG MOVEMENT DATA The total Periodic Limb Movements of Sleep (PLMS) were 69. The PLMS index was 12.83 .  IMPRESSIONS - Mild to moderate obstructive sleep apnea occurred during the diagnostic portion of the study (AHI = 14.5 /hour). An optimal PAP pressure was selected for this patient ( 14 cm of water) - No significant central sleep apnea occurred during the diagnostic portion of the study (CAI = 1.9/hour). - The patient had minimal or no oxygen desaturation during the diagnostic portion of the study (Min O2 = 87.00%) - The patient snored with moderate snoring volume during the diagnostic portion of the study. - EKG findings include PVCs. - Mild periodic limb movements of sleep occurred during the study.  DIAGNOSIS - Obstructive Sleep Apnea (327.23 [G47.33 ICD-10]) - Periodic Limb Movement Syndrome (327.51 [G47.61 ICD-10])  RECOMMENDATIONS - Trial of CPAP therapy on 14 cm H2O with a Small size Philips Respironics Full Face Mask Dreamwear mask and heated humidification. - Be careful with alcohol, sedatives and other CNS depressants that may worsen sleep apnea and disrupt normal sleep architecture. -  Sleep hygiene should be reviewed to assess factors that may improve sleep quality. - Weight management and regular exercise should be initiated or continued. - Consider treatment for periodic limb movement sleep disorder if  indicated.  [Electronically signed] 08/18/2017 05:49 PM  Baird Lyons MD, [place], American Board of Sleep Medicine   NPI: [id number]  [place], American Board of Sleep Medicine  ELECTRONICALLY SIGNED ON:  08/18/2017, 5:46 PM [place] PH: [phone]   FX: [phone] [place]

## 2020-09-01 DIAGNOSIS — K8050 Calculus of bile duct without cholangitis or cholecystitis without obstruction: Secondary | ICD-10-CM | POA: Diagnosis not present

## 2020-09-01 DIAGNOSIS — R1011 Right upper quadrant pain: Secondary | ICD-10-CM | POA: Diagnosis not present

## 2020-09-23 DIAGNOSIS — K9189 Other postprocedural complications and disorders of digestive system: Secondary | ICD-10-CM | POA: Diagnosis not present

## 2020-09-23 DIAGNOSIS — R110 Nausea: Secondary | ICD-10-CM | POA: Diagnosis not present

## 2021-06-12 ENCOUNTER — Telehealth: Payer: Self-pay

## 2021-06-12 NOTE — Telephone Encounter (Signed)
Tried calling patient  no answer  New spreadsheet stated dr sanders was patient pcp  I was updated our information and it says dr sanders patient PCP.  [facility] [place]  Patient has never been seen @ [facility]  Please confirm

## 2021-07-09 ENCOUNTER — Encounter (HOSPITAL_COMMUNITY): Payer: Self-pay

## 2021-07-09 ENCOUNTER — Other Ambulatory Visit: Payer: Self-pay

## 2021-07-09 ENCOUNTER — Emergency Department (HOSPITAL_COMMUNITY)
Admission: EM | Admit: 2021-07-09 | Discharge: 2021-07-09 | Disposition: A | Discharge status: Home / Self Care | Payer: Medicare HMO | Attending: Student | Admitting: Student

## 2021-07-09 DIAGNOSIS — R0981 Nasal congestion: Secondary | ICD-10-CM | POA: Diagnosis not present

## 2021-07-09 DIAGNOSIS — Z20822 Contact with and (suspected) exposure to covid-19: Secondary | ICD-10-CM | POA: Insufficient documentation

## 2021-07-09 DIAGNOSIS — R059 Cough, unspecified: Secondary | ICD-10-CM | POA: Diagnosis not present

## 2021-07-09 DIAGNOSIS — R1013 Epigastric pain: Secondary | ICD-10-CM | POA: Diagnosis not present

## 2021-07-09 DIAGNOSIS — R519 Headache, unspecified: Secondary | ICD-10-CM | POA: Insufficient documentation

## 2021-07-09 DIAGNOSIS — Z5321 Procedure and treatment not carried out due to patient leaving prior to being seen by health care provider: Secondary | ICD-10-CM | POA: Insufficient documentation

## 2021-07-09 DIAGNOSIS — R439 Unspecified disturbances of smell and taste: Secondary | ICD-10-CM | POA: Insufficient documentation

## 2021-07-09 LAB — CBC WITH DIFFERENTIAL/PLATELET
Abs Immature Granulocytes: 0.03 10*3/uL (ref 0.00–0.07)
Basophils Absolute: 0 10*3/uL (ref 0.0–0.1)
Basophils Relative: 1 %
Eosinophils Absolute: 0.1 10*3/uL (ref 0.0–0.5)
Eosinophils Relative: 1 %
HCT: 41.6 % (ref 36.0–46.0)
Hemoglobin: 13.3 g/dL (ref 12.0–15.0)
Immature Granulocytes: 0 %
Lymphocytes Relative: 28 %
Lymphs Abs: 2.4 10*3/uL (ref 0.7–4.0)
MCH: 30.6 pg (ref 26.0–34.0)
MCHC: 32 g/dL (ref 30.0–36.0)
MCV: 95.6 fL (ref 80.0–100.0)
Monocytes Absolute: 0.5 10*3/uL (ref 0.1–1.0)
Monocytes Relative: 6 %
Neutro Abs: 5.4 10*3/uL (ref 1.7–7.7)
Neutrophils Relative %: 64 %
Platelets: 332 10*3/uL (ref 150–400)
RBC: 4.35 MIL/uL (ref 3.87–5.11)
RDW: 14 % (ref 11.5–15.5)
WBC: 8.5 10*3/uL (ref 4.0–10.5)
nRBC: 0 % (ref 0.0–0.2)

## 2021-07-09 LAB — RESP PANEL BY RT-PCR (FLU A&B, COVID) ARPGX2
Influenza A by PCR: NEGATIVE
Influenza B by PCR: NEGATIVE
SARS Coronavirus 2 by RT PCR: NEGATIVE

## 2021-07-09 LAB — LIPASE, BLOOD: Lipase: 25 U/L (ref 11–51)

## 2021-07-09 LAB — COMPREHENSIVE METABOLIC PANEL
ALT: 11 U/L (ref 0–44)
AST: 14 U/L — ABNORMAL LOW (ref 15–41)
Albumin: 3.6 g/dL (ref 3.5–5.0)
Alkaline Phosphatase: 69 U/L (ref 38–126)
Anion gap: 8 (ref 5–15)
BUN: 8 mg/dL (ref 6–20)
CO2: 25 mmol/L (ref 22–32)
Calcium: 9.3 mg/dL (ref 8.9–10.3)
Chloride: 105 mmol/L (ref 98–111)
Creatinine, Ser: 0.61 mg/dL (ref 0.44–1.00)
GFR, Estimated: >60 mL/min (ref >60)
Glucose, Bld: 107 mg/dL — ABNORMAL HIGH (ref 70–99)
Potassium: 3.5 mmol/L (ref 3.5–5.1)
Sodium: 138 mmol/L (ref 135–145)
Total Bilirubin: 0.5 mg/dL (ref 0.3–1.2)
Total Protein: 7.6 g/dL (ref 6.5–8.1)

## 2021-07-09 NOTE — ED Triage Notes (Signed)
Pt reports cough, chest congestion, headache, and loss of taste and smell on Friday.   She also reports ongoing abdominal discomfort for a month, believes it is due to her hernia.

## 2021-07-09 NOTE — ED Provider Notes (Cosign Needed)
Emergency Medicine Provider Triage Evaluation Note  Alyssa Schwartz , a 60 y.o. female  was evaluated in triage.  Pt complains of abdominal pain x1 month and COVID symptoms x1 week.  Patient has history of multiple abdominal surgeries, has epigastric abdominal pain that has been worsening over the last month.  No associated nausea, vomiting constipation.  Lost sense of taste and smell for 1 week, COVID vaccinated and boosted..  Review of Systems  Positive: Fever, body aches, abdominal pain, nasal congestion, headache Negative: Diarrhea, constipation, dysuria  Physical Exam  BP (!) 144/69 (BP Location: Left Arm)   Pulse 85   Temp 98.4 F (36.9 C) (Oral)   Resp 18   SpO2 95%  Gen:   Awake, no distress   Resp:  Normal effort  MSK:   Moves extremities without difficulty  Other:  Epigastric area firm, diffuse abdominal tenderness.  Medical Decision Making  Medically screening exam initiated at 1:58 PM.  Appropriate orders placed.  Alyssa Schwartz was informed that the remainder of the evaluation will be completed by another provider, this initial triage assessment does not replace that evaluation, and the importance of remaining in the ED until their evaluation is complete.  Abdomen tender, epigastric area is firm.  We will do abdominal labs and also test for COVID.   Theron Arista, PA-C 07/09/21 1359

## 2021-07-10 ENCOUNTER — Telehealth: Payer: Self-pay

## 2021-07-10 NOTE — Telephone Encounter (Signed)
Pt aware covid lab test negative, not detected °

## 2024-04-01 ENCOUNTER — Ambulatory Visit (INDEPENDENT_AMBULATORY_CARE_PROVIDER_SITE_OTHER): Admitting: Otolaryngology

## 2024-04-01 ENCOUNTER — Encounter (INDEPENDENT_AMBULATORY_CARE_PROVIDER_SITE_OTHER): Payer: Self-pay | Admitting: Otolaryngology

## 2024-04-01 VITALS — BP 128/85 | HR 85 | Ht 71.0 in | Wt 187.0 lb

## 2024-04-01 DIAGNOSIS — G4733 Obstructive sleep apnea (adult) (pediatric): Secondary | ICD-10-CM | POA: Diagnosis not present

## 2024-04-01 DIAGNOSIS — Z91198 Patient's noncompliance with other medical treatment and regimen for other reason: Secondary | ICD-10-CM | POA: Diagnosis not present

## 2024-04-01 DIAGNOSIS — Z789 Other specified health status: Secondary | ICD-10-CM

## 2024-04-01 DIAGNOSIS — F17210 Nicotine dependence, cigarettes, uncomplicated: Secondary | ICD-10-CM

## 2024-04-01 DIAGNOSIS — F17200 Nicotine dependence, unspecified, uncomplicated: Secondary | ICD-10-CM

## 2024-04-01 NOTE — Progress Notes (Signed)
 Dear Dr. Karlynn Oyster, Here is my assessment for our mutual patient, Alyssa Schwartz. Thank you for allowing me the opportunity to care for your patient. Please do not hesitate to contact me should you have any other questions. Sincerely, Dr. Milon Aloe  Otolaryngology Clinic Note Referring provider: Dr. Karlynn Oyster HPI:  Alyssa Schwartz is a 63 y.o. female kindly referred by Dr. Karlynn Oyster for evaluation of obstructive sleep apnea with CPAP intolerance.  Initial visit (03/2024): Initially diagnosed more than 10 years ago, and was using CPAP but now unable to tolerate it for past several years because it chokes her.   Using CPAP: no Interventions: tried multiple masks - nasal pillows, full mask, but feels claustrophobic and feels like it chokes her so took it off. Currently not using CPAP Insomnia: uses trazodone for sleep (moderate insomnia) Chest surgery: no Neck surgery: never Denies dysphonia, dysphagia Workup so far: Sleep study  H&N Surgery: no Personal or FHx of bleeding dz or anesthesia difficulty: no  GLP-1: no AP/AC: no  PCP: Dr. Esther Hem  Tobacco: smokes 5 cigarettes/day (40 pack year - 1 PPD prior)  PMHx: Asthma, OSA, HLD, GERD  Independent Review of Additional Tests or Records:  Home sleep test (06/18/2023): [facility]   BMP 08/07/2023: BUN/Cr 5/0.74; CBC 02/05/2023: WBC 6.98, Hgb 12.6 [facility] reviewed and uploaded or available in chart in media tab: Noted Mod OSA, could not tolerate CPAP; interested in inspire PMH/Meds/All/SocHx/FamHx/ROS:   Past Medical History:  Diagnosis Date   Anxiety    Asthma    Carpal tunnel syndrome of left wrist 12/20/2012   Depression    GERD (gastroesophageal reflux disease)    History of foot surgery    2 surgeries on left foot, 3 on right foot   Mental disorder    anger issues   Nerve damage    hands   Plantar fasciitis    bilateral   Seizures (HCC)    last sz >20 yrs ago   Sleep apnea    does not use CPAP      Past Surgical History:  Procedure Laterality Date   BUNIONECTOMY     CARPAL TUNNEL RELEASE Left 12/26/2012   Procedure: CARPAL TUNNEL RELEASE;  Surgeon: Kemp Patter, MD;  Location: [facility];  Service: Orthopedics;  Laterality: Left;   CARPAL TUNNEL RELEASE Right 01/12/2013   Procedure: Right Carpal Tunnel Release;  Surgeon: Kemp Patter, MD;  Location:  SUR[facility]ERY [facility];  Service: Orthopedics;  Laterality: Right;   ELBOW SURGERY Left    HAMMER TOE SURGERY     PLANTAR FASCIA RELEASE      History reviewed. No pertinent family history.   Social Connections: Not on file      Current Outpatient Medications:    albuterol  (PROVENTIL  HFA;VENTOLIN  HFA) 108 (90 BASE) MCG/ACT inhaler, Inhale 2 puffs into the lungs every 6 (six) hours as needed. For shortness of breath or wheeze , Disp: , Rfl:    omeprazole (PRILOSEC) 10 MG capsule, Take 10 mg by mouth 2 (two) times daily., Disp: , Rfl:    atorvastatin (LIPITOR) 20 MG tablet, Take 20 mg by mouth daily., Disp: , Rfl:    QUEtiapine (SEROQUEL) 200 MG tablet, Take 200 mg by mouth at bedtime., Disp: , Rfl:    traZODone (DESYREL) 150 MG tablet, Take by mouth at bedtime., Disp: , Rfl:    Physical Exam:   BP 128/85 (BP Location: Right Arm, Patient Position: Sitting, Cuff Size: Normal)  Pulse 85   Ht 5' 11 (1.803 m)   Wt 187 lb (84.8 kg)   SpO2 94%   BMI 26.08 kg/m   Salient findings:  CN II-XII intact  Bilateral EAC clear and TM intact with well pneumatized middle ear spaces Anterior rhinoscopy: Septum relatively midline; bilateral inferior turbinates without significant hypertrophy No lesions of oral cavity/oropharynx; tongue friedman 2 No obviously palpable neck masses/lymphadenopathy/thyromegaly No respiratory distress or stridor; TFL was indicated to better evaluate the proximal airway, given the patient's history and exam findings, and is detailed below. No neck incisions/scars  Seprately Identifiable  Procedures:  Prior to initiating any procedures, risks/benefits/alternatives were explained to the patient and verbal consent obtained. Procedure Note Pre-procedure diagnosis:  Obstructive sleep apnea, rule out structural cause Post-procedure diagnosis: Same Procedure: Transnasal Fiberoptic Laryngoscopy, CPT 31575 - Mod 25 Indication: see above Complications: None apparent EBL: 0 mL  The procedure was undertaken to further evaluate the patient's complaint above, with mirror exam inadequate for appropriate examination due to gag reflex and poor patient tolerance  Procedure:  Patient was identified as correct patient. Verbal consent was obtained. The nose was sprayed with oxymetazoline and 4% lidocaine . The The flexible laryngoscope was passed through the nose to view the nasal cavity, pharynx (oropharynx, hypopharynx) and larynx.  The larynx was examined at rest and during multiple phonatory tasks. Documentation was obtained and reviewed with patient. The scope was removed. The patient tolerated the procedure well.  Findings: The nasal cavity and nasopharynx did not reveal any masses or lesions, mucosa appeared to be without obvious lesions. The tongue base, pharyngeal walls, piriform sinuses, vallecula, epiglottis and postcricoid region are normal in appearance. The visualized portion of the subglottis and proximal trachea is widely patent. The vocal folds are mobile bilaterally. There are no lesions on the free edge of the vocal folds nor elsewhere in the larynx worrisome for malignancy.    Electronically signed by: Evelina Hippo, MD 04/04/2024 6:01 PM   Impression & Plans:  [name] is a 63 y.o. female with:  1. OSA (obstructive sleep apnea)   2. Intolerance of continuous positive airway pressure (CPAP) ventilation   3. Tobacco use disorder    Unable to tolerate CPAP despite trials due to feeling choked We discussed options including continued CPAP v/s Inspire.  We had a discussion  regarding risks of Inspire including lack of benefit, persistent symptoms, pneumothorax, tongue soreness or weakness, Floor of mouth numbness, injury to major vessels, hematoma, implant infection, bleeding, scarring, tethering of neck, persistent symptoms, need for further procedures, and risk of anesthesia among others.   She would like to proceed with DISE and if approved, Inspire.  Given the patient's tobacco use, I also discussed cessation and options for cessation, including counseling. Counseled patient on the dangers of tobacco use, advised patient to stop smoking, and reviewed strategies to maximize success. Total time spent with this was 4 minutes.    See below regarding exact medications prescribed this encounter including dosages and route: No orders of the defined types were placed in this encounter.     Thank you for allowing me the opportunity to care for your patient. Please do not hesitate to contact me should you have any other questions.  Sincerely, Milon Aloe, MD Otolaryngologist (ENT), [facility] Phone: [phone] Fax: [phone]  04/04/2024, 6:01 PM   MDM:  Level 4 - 99204 Complexity/Problems addressed: mod  Data complexity: mod  independent review of notes, labs, tests - Morbidity: mod -  decision for surgery  - Prescription Drug prescribed or managed: no

## 2024-04-03 ENCOUNTER — Encounter (HOSPITAL_BASED_OUTPATIENT_CLINIC_OR_DEPARTMENT_OTHER): Payer: Self-pay | Admitting: *Deleted

## 2024-04-03 ENCOUNTER — Other Ambulatory Visit: Payer: Self-pay

## 2024-04-10 ENCOUNTER — Ambulatory Visit (HOSPITAL_BASED_OUTPATIENT_CLINIC_OR_DEPARTMENT_OTHER): Admitting: Anesthesiology

## 2024-04-10 ENCOUNTER — Encounter (HOSPITAL_BASED_OUTPATIENT_CLINIC_OR_DEPARTMENT_OTHER)
Admission: RE | Disposition: A | Discharge status: Home / Self Care | Payer: Self-pay | Source: Home / Self Care | Attending: Otolaryngology

## 2024-04-10 ENCOUNTER — Encounter (HOSPITAL_BASED_OUTPATIENT_CLINIC_OR_DEPARTMENT_OTHER): Payer: Self-pay

## 2024-04-10 ENCOUNTER — Other Ambulatory Visit: Payer: Self-pay

## 2024-04-10 ENCOUNTER — Telehealth (INDEPENDENT_AMBULATORY_CARE_PROVIDER_SITE_OTHER): Payer: Self-pay | Admitting: Otolaryngology

## 2024-04-10 ENCOUNTER — Ambulatory Visit (HOSPITAL_BASED_OUTPATIENT_CLINIC_OR_DEPARTMENT_OTHER)
Admission: RE | Admit: 2024-04-10 | Discharge: 2024-04-10 | Disposition: A | Discharge status: Home / Self Care | Attending: Otolaryngology | Admitting: Otolaryngology

## 2024-04-10 DIAGNOSIS — Z91198 Patient's noncompliance with other medical treatment and regimen for other reason: Secondary | ICD-10-CM

## 2024-04-10 DIAGNOSIS — G4733 Obstructive sleep apnea (adult) (pediatric): Secondary | ICD-10-CM

## 2024-04-10 DIAGNOSIS — Z789 Other specified health status: Secondary | ICD-10-CM

## 2024-04-10 DIAGNOSIS — F418 Other specified anxiety disorders: Secondary | ICD-10-CM | POA: Diagnosis not present

## 2024-04-10 DIAGNOSIS — F17210 Nicotine dependence, cigarettes, uncomplicated: Secondary | ICD-10-CM | POA: Diagnosis not present

## 2024-04-10 DIAGNOSIS — F17200 Nicotine dependence, unspecified, uncomplicated: Secondary | ICD-10-CM | POA: Insufficient documentation

## 2024-04-10 DIAGNOSIS — J45909 Unspecified asthma, uncomplicated: Secondary | ICD-10-CM

## 2024-04-10 DIAGNOSIS — F419 Anxiety disorder, unspecified: Secondary | ICD-10-CM | POA: Diagnosis not present

## 2024-04-10 DIAGNOSIS — F32A Depression, unspecified: Secondary | ICD-10-CM | POA: Diagnosis not present

## 2024-04-10 DIAGNOSIS — K219 Gastro-esophageal reflux disease without esophagitis: Secondary | ICD-10-CM | POA: Diagnosis not present

## 2024-04-10 DIAGNOSIS — R569 Unspecified convulsions: Secondary | ICD-10-CM | POA: Diagnosis not present

## 2024-04-10 DIAGNOSIS — Z01818 Encounter for other preprocedural examination: Secondary | ICD-10-CM

## 2024-04-10 HISTORY — DX: Depression, unspecified: F32.A

## 2024-04-10 HISTORY — PX: DRUG INDUCED ENDOSCOPY: SHX6808

## 2024-04-10 HISTORY — DX: Gastro-esophageal reflux disease without esophagitis: K21.9

## 2024-04-10 SURGERY — DRUG INDUCED SLEEP ENDOSCOPY
Anesthesia: Monitor Anesthesia Care | Site: Nose

## 2024-04-10 MED ORDER — OXYMETAZOLINE HCL 0.05 % NA SOLN
NASAL | Status: DC | PRN
  Administered 2024-04-10: 1 via TOPICAL

## 2024-04-10 MED ORDER — PROPOFOL 500 MG/50ML IV EMUL
INTRAVENOUS | Status: DC | PRN
  Administered 2024-04-10: 100 ug/kg/min via INTRAVENOUS

## 2024-04-10 MED ORDER — FENTANYL CITRATE (PF) 100 MCG/2ML IJ SOLN: 25.0000 ug | INTRAMUSCULAR | Status: DC | PRN

## 2024-04-10 MED ORDER — OXYCODONE HCL 5 MG/5ML PO SOLN: 5.0000 mg | Freq: Once | ORAL | Status: DC | PRN

## 2024-04-10 MED ORDER — ONDANSETRON HCL 4 MG/2ML IJ SOLN
INTRAMUSCULAR | Status: DC | PRN
  Administered 2024-04-10: 4 mg via INTRAVENOUS

## 2024-04-10 MED ORDER — OXYCODONE HCL 5 MG PO TABS: 5.0000 mg | ORAL_TABLET | Freq: Once | ORAL | Status: DC | PRN

## 2024-04-10 MED ORDER — ONDANSETRON HCL 4 MG/2ML IJ SOLN
INTRAMUSCULAR | Status: AC
  Filled 2024-04-10: qty 2

## 2024-04-10 MED ORDER — PROPOFOL 10 MG/ML IV BOLUS
INTRAVENOUS | Status: DC | PRN
  Administered 2024-04-10 (×3): 10 mg via INTRAVENOUS

## 2024-04-10 MED ORDER — KETOROLAC TROMETHAMINE 30 MG/ML IJ SOLN: 30.0000 mg | Freq: Once | INTRAMUSCULAR | Status: DC | PRN

## 2024-04-10 MED ORDER — ACETAMINOPHEN 500 MG PO TABS
ORAL_TABLET | ORAL | Status: AC
  Filled 2024-04-10: qty 2

## 2024-04-10 MED ORDER — AMISULPRIDE (ANTIEMETIC) 5 MG/2ML IV SOLN: 10.0000 mg | Freq: Once | INTRAVENOUS | Status: DC | PRN

## 2024-04-10 MED ORDER — LACTATED RINGERS IV SOLN: INTRAVENOUS | Status: DC

## 2024-04-10 MED ORDER — ACETAMINOPHEN 500 MG PO TABS
1000.0000 mg | ORAL_TABLET | Freq: Once | ORAL | Status: AC
  Administered 2024-04-10: 1000 mg via ORAL

## 2024-04-10 MED ORDER — PROPOFOL 10 MG/ML IV BOLUS
INTRAVENOUS | Status: AC
  Filled 2024-04-10: qty 20

## 2024-04-10 SURGICAL SUPPLY — 15 items
CANISTER SUCT 1200ML W/VALVE (MISCELLANEOUS) ×1 IMPLANT
GLOVE BIO SURGEON STRL SZ 6.5 (GLOVE) ×1 IMPLANT
GLOVE BIO SURGEON STRL SZ7 (GLOVE) IMPLANT
GLOVE BIOGEL PI IND STRL 6.5 (GLOVE) IMPLANT
GLOVE BIOGEL PI IND STRL 7.5 (GLOVE) IMPLANT
GLOVE SURG SS PI 6.0 STRL IVOR (GLOVE) IMPLANT
KIT CLEAN ENDO (MISCELLANEOUS) ×1 IMPLANT
NDL PRECISIONGLIDE 27X1.5 (NEEDLE) IMPLANT
NEEDLE PRECISIONGLIDE 27X1.5 (NEEDLE) IMPLANT
PATTIES SURGICAL .5 X3 (DISPOSABLE) ×1 IMPLANT
SHEET MEDIUM DRAPE 40X70 STRL (DRAPES) ×1 IMPLANT
SOLUTION ANTFG W/FOAM PAD STRL (MISCELLANEOUS) ×1 IMPLANT
SYR CONTROL 10ML LL (SYRINGE) IMPLANT
TOWEL GREEN STERILE FF (TOWEL DISPOSABLE) ×1 IMPLANT
TUBE CONNECTING 20X1/4 (TUBING) IMPLANT

## 2024-04-10 NOTE — Anesthesia Preprocedure Evaluation (Addendum)
 Anesthesia Evaluation  Patient identified by MRN, date of birth, ID band Patient awake    Reviewed: Allergy & Precautions, NPO status , Patient's Chart, lab work & pertinent test results  Airway Mallampati: II       Dental no notable dental hx.    Pulmonary asthma , sleep apnea , Current Smoker and Patient abstained from smoking.   Pulmonary exam normal        Cardiovascular negative cardio ROS Normal cardiovascular exam     Neuro/Psych Seizures -,  PSYCHIATRIC DISORDERS Anxiety Depression     Neuromuscular disease    GI/Hepatic ,GERD  Medicated and Controlled,,(+)     substance abuse    Endo/Other  negative endocrine ROS    Renal/GU negative Renal ROS     Musculoskeletal negative musculoskeletal ROS (+)    Abdominal   Peds  Hematology negative hematology ROS (+)   Anesthesia Other Findings Obstructive sleep apnea   Reproductive/Obstetrics                             Anesthesia Physical Anesthesia Plan  ASA: 3  Anesthesia Plan: MAC   Post-op Pain Management:    Induction:   PONV Risk Score and Plan: 1 and Propofol  infusion and Treatment may vary due to age or medical condition  Airway Management Planned: Nasal Cannula  Additional Equipment:   Intra-op Plan:   Post-operative Plan:   Informed Consent: I have reviewed the patients History and Physical, chart, labs and discussed the procedure including the risks, benefits and alternatives for the proposed anesthesia with the patient or authorized representative who has indicated his/her understanding and acceptance.     Dental advisory given  Plan Discussed with: CRNA  Anesthesia Plan Comments:        Anesthesia Quick Evaluation

## 2024-04-10 NOTE — Telephone Encounter (Signed)
 ENT Note: Patient passed DISE is an anatomic candidate for hypoglossal nerve stimulator placement. Will post for insurance approval Evelina Hippo

## 2024-04-10 NOTE — Discharge Instructions (Addendum)
 Post Anesthesia Guidelines Surgery Discharge Instructions:  Call clinic or return to ED if you: - develop a fever greater than 101.4 - have shaking chills or are feeling ill - become short of breath - have uncontrollable nausea or vomiting - can't hold down food or liquids or feel as though you are getting dehydrated - any other acute events, problems, or concerns  Wound Care/Dressings/Drain Instructions:  - Some blood tinged drainage from nose may occur for first few days. Any profuse bleeding from nose, please call our office You are a candidate for the inspire implant -- we will submit for [facility] approval. Please call our office if you do not hear back from us  in about 2 weeks.  Medications: - Resume your regular home medications except as detailed in the medication reconciliation.   Activity/Restrictions:  - Resume your regular activities, as tolerated.   Diet: - Resume your regular diet, as tolerated   Post Anesthesia Home Care Instructions  Activity: Get plenty of rest for the remainder of the day. A responsible individual must stay with you for 24 hours following the procedure.  For the next 24 hours, DO NOT: -Drive a car -Advertising copywriter -Drink alcoholic beverages -Take any medication unless instructed by your physician -Make any legal decisions or sign important papers.  Meals: Start with liquid foods such as gelatin or soup. Progress to regular foods as tolerated. Avoid greasy, spicy, heavy foods. If nausea and/or vomiting occur, drink only clear liquids until the nausea and/or vomiting subsides. Call your physician if vomiting continues.  Special Instructions/Symptoms: Your throat may feel dry or sore from the anesthesia or the breathing tube placed in your throat during surgery. If this causes discomfort, gargle with warm salt water. The discomfort should disappear within 24 hours.  If you had a scopolamine patch placed behind your ear for the management of post-  operative nausea and/or vomiting:  1. The medication in the patch is effective for 72 hours, after which it should be removed.  Wrap patch in a tissue and discard in the trash. Wash hands thoroughly with soap and water. 2. You may remove the patch earlier than 72 hours if you experience unpleasant side effects which may include dry mouth, dizziness or visual disturbances. 3. Avoid touching the patch. Wash your hands with soap and water after contact with the patch.     Last received tylenol  at 912am

## 2024-04-10 NOTE — Transfer of Care (Signed)
 Immediate Anesthesia Transfer of Care Note  Patient: Alyssa Schwartz  Procedure(s) Performed: DRUG INDUCED SLEEP ENDOSCOPY (Nose)  Patient Location: PACU  Anesthesia Type:MAC  Level of Consciousness: awake, alert , and oriented  Airway & Oxygen Therapy: Patient Spontanous Breathing  Post-op Assessment: Report given to RN and Post -op Vital signs reviewed and stable  Post vital signs: Reviewed and stable  Last Vitals:  Vitals Value Taken Time  BP 122/88 04/10/24 10:55  Temp    Pulse 79 04/10/24 10:57  Resp    SpO2 97 % 04/10/24 10:57  Vitals shown include unfiled device data.  Last Pain:  Vitals:   04/10/24 0902  TempSrc: Temporal  PainSc: 8       Patients Stated Pain Goal: 3 (04/10/24 0902)  Complications: No notable events documented.

## 2024-04-10 NOTE — Op Note (Signed)
 Otolaryngology Operative note  Alyssa Schwartz Date/Time of Admission: 04/10/2024  8:37 AM  CSN: [id number];OAC:[id number]  DOB: 03-17-1961 Age: 63 y.o. Location: [facility]    Pre-Op Diagnosis: OBSTRUCTIVE SLEEP APNEA INTOLERANCE OF CONTINUOUS POSITIVE AIRWAY PRESSURE VENTILATION   Post-Op Diagnosis: Same   Procedure: Procedure(s): DRUG INDUCED SLEEP ENDOSCOPY USING Floreen Hunger- CPT [phone]  Surgeon: Milon Aloe, MD  Anesthesia type:  MAC  Anesthesiologist: Anesthesiologist: Peggi Bowels, MD CRNA: Arvilla Birmingham, CRNA   Staff: Circulator: Jolaine Nasuti, RN Scrub Person: Tasia Farr K  Implants: None  Specimens: None  EBL: minimal  Drains: None  Post-op disposition and condition: PACU, hemodynamically stable  Findings: There was no evidence of complete concentric palatal obstruction and she is a candidate anatomically for hypoglossal nerve stimulation therapy.    Complications: None apparent  Indications and consent:  Alyssa Schwartz is a 63 y.o. female with obstructive sleep apnea with intolerance of continuous positive airway pressure. As such, with a BMI of 25.77, patient's options were discussed including Hypoglossal nerve stimulator placement. Risks/benefits/alternatives for each option were discussed. Patient expressed understanding, and despite these risks, consented and decided to proceed with drug induced sleep endoscopy to determine stimulator placement candidacy. Informed consent was signed before proceeding.  Procedure: The patient was brought to the endoscopy room and was anesthetized via the standard drug-induced sleep endoscopy protocol using propofol  pump. The room lights were dimmed. The propofol  infusion rate was started at 50 mcg and gradually increased at which point, conditions that mimic sleep were gradually observed.   With the patient not responsive to verbal commands, but still with spontaneous respiration, sleep  disordered breathing events were clearly observed.   Under these conditions, the flexible endoscope was inserted to examine both sides of the nose as well as the pharynx and larynx.   There was modest AP collapse (<50%) of the palate but no lateral collapse. With simulated jaw thrust, the hypopharyngeal obstruction and secondarily the palatal collapse was improved.   In summary, there was no evidence of complete concentric palatal obstruction and she is a candidate anatomically for hypoglossal nerve stimulation therapy.   The anesthesia was then weaned and care transferred to anesthesia who transported patient to PACU in stable condition.   I was present for and performed the entire procedure.   Cire Clute B Hendy Brindle

## 2024-04-10 NOTE — H&P (Signed)
 Pre-Operative H&P - Day Of Surgery Patient Name: Alyssa Schwartz Date:   04/10/2024  HPI: [name] is a 63 y.o. female who presents today for operative treatment of obstructive sleep apnea, intolerance of continuous positive airway pressure ventilation. Patient denies recent significant changes to health or significant new medications or physiologic change in condition which would immediately impact plans. No new types of therapy has been initiated that would change the plan or the appropriateness of the plan.   ROS:  A complete review of systems was obtained and is otherwise negative.   PMH:  Past Medical History:  Diagnosis Date   Anxiety    Asthma    Carpal tunnel syndrome of left wrist 12/20/2012   Depression    GERD (gastroesophageal reflux disease)    History of foot surgery    2 surgeries on left foot, 3 on right foot   Mental disorder    anger issues   Nerve damage    hands   Plantar fasciitis    bilateral   Seizures (HCC)    last sz >20 yrs ago   Sleep apnea    does not use CPAP    PSH:  Past Surgical History:  Procedure Laterality Date   BUNIONECTOMY     CARPAL TUNNEL RELEASE Left 12/26/2012   Procedure: CARPAL TUNNEL RELEASE;  Surgeon: Kemp Patter, MD;  Location: [facility];  Service: Orthopedics;  Laterality: Left;   CARPAL TUNNEL RELEASE Right 01/12/2013   Procedure: Right Carpal Tunnel Release;  Surgeon: Kemp Patter, MD;  Location: [facility];  Service: Orthopedics;  Laterality: Right;   ELBOW SURGERY Left    HAMMER TOE SURGERY     PLANTAR FASCIA RELEASE      MEDS:   Current Facility-Administered Medications:    acetaminophen  (TYLENOL ) tablet 1,000 mg, 1,000 mg, Oral, Once, Ellender, Tommie Frame, MD   lactated ringers  infusion, , Intravenous, Continuous, Jonne Netters, MD  ALLERGIES: Patient has no known allergies.  EXAM: Vitals: Ht 5' 11 (1.803 m)   Wt 84.8 kg   BMI 26.07 kg/m   General Awake, at baseline alertness.   HEENT  No scleral icterus or conjunctival hemorrhage. Globe position appears normal. External ears  normal. Nose patent without rhinorrhea. No lymphadenopathy. No thyromegaly  Cardiovascular No cyanosis.  Pulmonary No audible stridor. Breathing easily with no labor.  Neuro Symmetric facial movement.   Psychiatry Appropriate affect and mood.  Skin No scars or lesions on face or neck.  Extermities Moves all extremities with normal range of motion.   Other Findings None.   Assessment & Plan: Milica has diagnoses of  obstructive sleep apnea, intolerance of continuous positive airway pressure ventilation and will go to the OR today for drug induced sleep endoscopy. Informed consent was obtained and available in EMR today. All questions have been answered, and risks/benefits/alternatives of procedure as noted in the consent were discussed in a quiet area. Questions were invited and answered. The patient expressed understanding, provided consent and wished to proceed despite risks.  Sabre Leonetti B Kaushik Maul 04/10/2024 9:00 AM

## 2024-04-10 NOTE — Anesthesia Postprocedure Evaluation (Signed)
 Anesthesia Post Note  Patient: Alyssa Schwartz  Procedure(s) Performed: DRUG INDUCED SLEEP ENDOSCOPY (Nose)     Patient location during evaluation: PACU Anesthesia Type: MAC Level of consciousness: awake Pain management: pain level controlled Vital Signs Assessment: post-procedure vital signs reviewed and stable Respiratory status: spontaneous breathing, nonlabored ventilation and respiratory function stable Cardiovascular status: blood pressure returned to baseline and stable Postop Assessment: no apparent nausea or vomiting Anesthetic complications: no   No notable events documented.  Last Vitals:  Vitals:   04/10/24 1058 04/10/24 1115  BP: 122/88 (!) 138/91  Pulse: 77 81  Resp: 18   Temp: (!) 36.4 C   SpO2: 98% 99%    Last Pain:  Vitals:   04/10/24 1058  TempSrc:   PainSc: 0-No pain                 Pattiann Solanki P Justise Ehmann

## 2024-04-11 ENCOUNTER — Encounter (HOSPITAL_BASED_OUTPATIENT_CLINIC_OR_DEPARTMENT_OTHER): Payer: Self-pay | Admitting: Otolaryngology

## 2024-06-12 ENCOUNTER — Ambulatory Visit (HOSPITAL_BASED_OUTPATIENT_CLINIC_OR_DEPARTMENT_OTHER): Admitting: Physical Therapy

## 2024-06-12 NOTE — Therapy (Deleted)
 OUTPATIENT PHYSICAL THERAPY SHOULDER EVALUATION   Patient Name: Alyssa Schwartz MRN: [id number] DOB:1961-01-08, 63 y.o., female Today's Date: 06/12/2024  END OF SESSION:   Past Medical History:  Diagnosis Date   Anxiety    Asthma    Carpal tunnel syndrome of left wrist 12/20/2012   Depression    GERD (gastroesophageal reflux disease)    History of foot surgery    2 surgeries on left foot, 3 on right foot   Mental disorder    anger issues   Nerve damage    hands   Plantar fasciitis    bilateral   Seizures (HCC)    last sz >20 yrs ago   Sleep apnea    does not use CPAP   Past Surgical History:  Procedure Laterality Date   BUNIONECTOMY     CARPAL TUNNEL RELEASE Left 12/26/2012   Procedure: CARPAL TUNNEL RELEASE;  Surgeon: Arley JONELLE Curia, MD;  Location: [facility];  Service: Orthopedics;  Laterality: Left;   CARPAL TUNNEL RELEASE Right 01/12/2013   Procedure: Right Carpal Tunnel Release;  Surgeon: Arley JONELLE Curia, MD;  Location: [facility];  Service: Orthopedics;  Laterality: Right;   DRUG INDUCED ENDOSCOPY N/A 04/10/2024   Procedure: DRUG INDUCED SLEEP ENDOSCOPY;  Surgeon: Tobie Eldora NOVAK, MD;  Location: [facility];  Service: ENT;  Laterality: N/A;   ELBOW SURGERY Left    HAMMER TOE SURGERY     PLANTAR FASCIA RELEASE     Patient Active Problem List   Diagnosis Date Noted   OSA (obstructive sleep apnea) 04/10/2024   Intolerance of continuous positive airway pressure (CPAP) ventilation 04/10/2024    PCP: ***  REFERRING PROVIDER: Canda Franky Jurist, MD  REFERRING DIAG: M75.02 (ICD-10-CM) - Adhesive capsulitis of left shoulder  THERAPY DIAG:  No diagnosis found.  Rationale for Evaluation and Treatment: Rehabilitation  ONSET DATE: ***  SUBJECTIVE:                                                                                                                                                                                       SUBJECTIVE STATEMENT: *** Hand dominance: {MISC; OT HAND DOMINANCE:[phone]}  PERTINENT HISTORY: ***  PAIN:  Are you having pain? {OPRCPAIN:27236}  PRECAUTIONS: {Therapy precautions:24002}  RED FLAGS: {PT Red Flags:29287}   WEIGHT BEARING RESTRICTIONS: {Yes ***/No:24003}  FALLS:  Has patient fallen in last 6 months? {fallsyesno:27318}  LIVING ENVIRONMENT: Lives with: {OPRC lives with:25569::lives with their family} Lives in: {Lives in:25570} Stairs: {opstairs:27293} Has following equipment at home: {Assistive devices:23999}  OCCUPATION: ***  PLOF: {PLOF:24004}  PATIENT GOALS:***  NEXT MD VISIT:   OBJECTIVE:  Note: Objective measures were completed at Evaluation unless otherwise noted.  DIAGNOSTIC FINDINGS:  ***  PATIENT SURVEYS:  {rehab surveys:24030:a}  COGNITION: Overall cognitive status: {cognition:24006}     SENSATION: {sensation:27233}  POSTURE: ***  UPPER EXTREMITY ROM:   {AROM/PROM:27142} ROM Right eval Left eval  Shoulder flexion    Shoulder extension    Shoulder abduction    Shoulder adduction    Shoulder internal rotation    Shoulder external rotation    Elbow flexion    Elbow extension    Wrist flexion    Wrist extension    Wrist ulnar deviation    Wrist radial deviation    Wrist pronation    Wrist supination    (Blank rows = not tested)  UPPER EXTREMITY MMT:  MMT Right eval Left eval  Shoulder flexion    Shoulder extension    Shoulder abduction    Shoulder adduction    Shoulder internal rotation    Shoulder external rotation    Middle trapezius    Lower trapezius    Elbow flexion    Elbow extension    Wrist flexion    Wrist extension    Wrist ulnar deviation    Wrist radial deviation    Wrist pronation    Wrist supination    Grip strength (lbs)    (Blank rows = not tested)  SHOULDER SPECIAL TESTS: Impingement tests: {shoulder impingement test:25231:a} SLAP lesions: {SLAP lesions:25232} Instability  tests: {shoulder instability test:25233} Rotator cuff assessment: {rotator cuff assessment:25234} Biceps assessment: {biceps assessment:25235}  JOINT MOBILITY TESTING:  ***  PALPATION:  ***                                                                                                                             TREATMENT DATE: ***   PATIENT EDUCATION: Education details: *** Person educated: {Person educated:25204} Education method: {Education Method:25205} Education comprehension: {Education Comprehension:25206}  HOME EXERCISE PROGRAM: ***  ASSESSMENT:  CLINICAL IMPRESSION: Patient is a *** y.o. *** who was seen today for physical therapy evaluation and treatment for ***.   OBJECTIVE IMPAIRMENTS: {opptimpairments:25111}.   ACTIVITY LIMITATIONS: {activitylimitations:27494}  PARTICIPATION LIMITATIONS: {participationrestrictions:25113}  PERSONAL FACTORS: {Personal factors:25162} are also affecting patient's functional outcome.   REHAB POTENTIAL: {rehabpotential:25112}  CLINICAL DECISION MAKING: {clinical decision making:25114}  EVALUATION COMPLEXITY: {Evaluation complexity:25115}   GOALS: Goals reviewed with patient? {yes/no:20286}  SHORT TERM GOALS: Target date: ***  *** Baseline: Goal status: INITIAL  2.  *** Baseline:  Goal status: INITIAL  3.  *** Baseline:  Goal status: INITIAL  4.  *** Baseline:  Goal status: INITIAL  5.  *** Baseline:  Goal status: INITIAL  6.  *** Baseline:  Goal status: INITIAL  LONG TERM GOALS: Target date: ***  *** Baseline:  Goal status: INITIAL  2.  *** Baseline:  Goal status: INITIAL  3.  *** Baseline:  Goal status: INITIAL  4.  *** Baseline:  Goal status: INITIAL  5.  ***  Baseline:  Goal status: INITIAL  6.  *** Baseline:  Goal status: INITIAL  PLAN:  PT FREQUENCY: {rehab frequency:25116}  PT DURATION: {rehab duration:25117}  PLANNED INTERVENTIONS: {rehab planned  interventions:25118::97110-Therapeutic exercises,97530- Therapeutic [phone]- Neuromuscular re-education,97535- Self Rjmz,02859- Manual therapy}  PLAN FOR NEXT SESSION: PIERRETTE Mose Minerva, PT 06/12/2024, 7:16 AM

## 2024-06-13 ENCOUNTER — Encounter (HOSPITAL_BASED_OUTPATIENT_CLINIC_OR_DEPARTMENT_OTHER): Payer: Self-pay | Admitting: Physical Therapy

## 2024-06-13 ENCOUNTER — Other Ambulatory Visit: Payer: Self-pay

## 2024-06-13 ENCOUNTER — Ambulatory Visit (HOSPITAL_BASED_OUTPATIENT_CLINIC_OR_DEPARTMENT_OTHER): Attending: Orthopedic Surgery | Admitting: Physical Therapy

## 2024-06-13 DIAGNOSIS — M6281 Muscle weakness (generalized): Secondary | ICD-10-CM | POA: Diagnosis present

## 2024-06-13 DIAGNOSIS — M25512 Pain in left shoulder: Secondary | ICD-10-CM | POA: Diagnosis present

## 2024-06-13 DIAGNOSIS — R29898 Other symptoms and signs involving the musculoskeletal system: Secondary | ICD-10-CM | POA: Insufficient documentation

## 2024-06-13 DIAGNOSIS — M25612 Stiffness of left shoulder, not elsewhere classified: Secondary | ICD-10-CM | POA: Diagnosis present

## 2024-06-13 NOTE — Therapy (Signed)
 OUTPATIENT PHYSICAL THERAPY SHOULDER EVALUATION   Patient Name: Alyssa Schwartz MRN: [id number] DOB:02-19-1961, 63 y.o., female Today's Date: 06/13/2024  END OF SESSION:  PT End of Session - 06/13/24 1039     Visit Number 1    Number of Visits 16    Date for PT Re-Evaluation 08/08/24    [facility]ization Type [facility]    Authorization Time Period 15 visits approved (preliminary) 05/19/24-09/16/24    Authorization - Visit Number 1    Authorization - Number of Visits 15    PT Start Time 1040    PT Stop Time 1110    PT Time Calculation (min) 30 min    Activity Tolerance Patient tolerated treatment well    Behavior During Therapy WFL for tasks assessed/performed          Past Medical History:  Diagnosis Date   Anxiety    Asthma    Carpal tunnel syndrome of left wrist 12/20/2012   Depression    GERD (gastroesophageal reflux disease)    History of foot surgery    2 surgeries on left foot, 3 on right foot   Mental disorder    anger issues   Nerve damage    hands   Plantar fasciitis    bilateral   Seizures (HCC)    last sz >20 yrs ago   Sleep apnea    does not use CPAP   Past Surgical History:  Procedure Laterality Date   BUNIONECTOMY     CARPAL TUNNEL RELEASE Left 12/26/2012   Procedure: CARPAL TUNNEL RELEASE;  Surgeon: Arley JONELLE Curia, MD;  Location: [facility];  Service: Orthopedics;  Laterality: Left;   CARPAL TUNNEL RELEASE Right 01/12/2013   Procedure: Right Carpal Tunnel Release;  Surgeon: Arley JONELLE Curia, MD;  Location: [facility];  Service: Orthopedics;  Laterality: Right;   DRUG INDUCED ENDOSCOPY N/A 04/10/2024   Procedure: DRUG INDUCED SLEEP ENDOSCOPY;  Surgeon: Tobie Eldora NOVAK, MD;  Location: [facility];  Service: ENT;  Laterality: N/A;   ELBOW SURGERY Left    HAMMER TOE SURGERY     PLANTAR FASCIA RELEASE     Patient Active Problem List   Diagnosis Date Noted   OSA (obstructive sleep apnea) 04/10/2024   Intolerance of continuous  positive airway pressure (CPAP) ventilation 04/10/2024    [facility]  REFERRING PROVIDER: Canda Franky Jurist, MD  REFERRING DIAG: M75.02 (ICD-10-CM) - Adhesive capsulitis of left shoulder  THERAPY DIAG:  Stiffness of left shoulder, not elsewhere classified  Left shoulder pain, unspecified chronicity  Muscle weakness (generalized)  Other symptoms and signs involving the musculoskeletal system  Rationale for Evaluation and Treatment: Rehabilitation  ONSET DATE: DOS 05/18/24  SUBJECTIVE:  SUBJECTIVE STATEMENT: Patient states L  shoulder surgery. She had an arthroscopic lysis of adhesions and manipulation under anesthesia with open distal clavicle excision on 05/18/24. Having surgery for a device to help her breath on 06/29/24. Minimal ability to use L arm currently.  Hand dominance: Right  PERTINENT HISTORY: HDL, adhesive capsulitis which began about 3-4 months ago  PAIN:  Are you having pain? Yes: NPRS scale: 8/10 Pain location: L shoulder Pain description: sharp Aggravating factors: movement Relieving factors: pain meds, ice  PRECAUTIONS: None  RED FLAGS: None   WEIGHT BEARING RESTRICTIONS: No  FALLS:  Has patient fallen in last 6 months? No  OCCUPATION: Retired   PLOF: Independent  PATIENT GOALS:good movement  OBJECTIVE: (objective measures from initial evaluation unless otherwise dated)  PATIENT SURVEYS:  UEFS  Extreme difficulty/unable (0), Quite a bit of difficulty (1), Moderate difficulty (2), Little difficulty (3), No difficulty (4) Survey date:  06/13/24  Any of your usual work, household or school activities 0  2. Your usual hobbies, recreational/sport activities 0   3. Lifting a bag of groceries to waist level 1   4. Lifting a bag of groceries above your head 1  5.  Grooming your hair 1  6. Pushing up on your hands (I.e. from bathtub or chair) 2  7. Preparing food (I.e. peeling/cutting) 1  8. Driving  0  9. Vacuuming, sweeping, or raking 0  10. Dressing  0  11. Doing up buttons 1  12. Using tools/appliances 0  13. Opening doors 3  14. Cleaning  1  15. Tying or lacing shoes 1  16. Sleeping  1  17. Laundering clothes (I.e. washing, ironing, folding) 1  18. Opening a jar 1  19. Throwing a ball 1  20. Carrying a small suitcase with your affected limb.  1  Score total:  16/80     COGNITION: Overall cognitive status: Within functional limits for tasks assessed     SENSATION: WFL  POSTURE: rounded shoulders, forward head, and increased thoracic kyphosis   UPPER EXTREMITY ROM: PROM: flexion: 123 degree, ER 25  Active ROM Right eval Left eval  Shoulder flexion 145 90*  Shoulder extension    Shoulder abduction 145 68*  Shoulder adduction    Shoulder internal rotation (functional) T8 L5  Shoulder external rotation (functional) T4 occiput  Elbow flexion    Elbow extension    Wrist flexion    Wrist extension    Wrist ulnar deviation    Wrist radial deviation    Wrist pronation    Wrist supination    (Blank rows = not tested) *=pain/symptoms  UPPER EXTREMITY MMT:  MMT Right eval Left eval  Shoulder flexion 4 3+*  Shoulder extension    Shoulder abduction 4+ 3+*  Shoulder adduction    Shoulder internal rotation 4+ 4-*  Shoulder external rotation 4 4-*  Middle trapezius    Lower trapezius    Elbow flexion 5 5  Elbow extension 5 5  Wrist flexion    Wrist extension    Wrist ulnar deviation    Wrist radial deviation    Wrist pronation    Wrist supination    Grip strength (lbs)    (Blank rows = not tested) *=pain/symptoms   JOINT MOBILITY TESTING:  Hypomobile GH  PALPATION:  No TTP L shoulder   TODAY'S TREATMENT:  DATE:  06/13/24 Supine AAROM flexion with SPC x 5 Supine AAROM ER with SPC x 5  PATIENT EDUCATION:  Education details: Patient educated on exam findings, POC, scope of PT, HEP, relevant anatomy/biomechanics. Person educated: Patient Education method: Explanation, Demonstration, and Handouts Education comprehension: verbalized understanding, returned demonstration, verbal cues required, and tactile cues required  HOME EXERCISE PROGRAM: Access Code: [email or web address] URL: [email or web address] Date: 06/13/2024 Prepared by: Prentice Rhoderick Farrel  Exercises - Supine Shoulder Flexion Extension AAROM with Dowel  - 3 x daily - 7 x weekly - 2 sets - 10 reps - Supine Shoulder External Rotation with Dowel  - 3 x daily - 7 x weekly - 2 sets - 10 reps - Shoulder Flexion Wall Slide with Towel (Mirrored)  - 3 x daily - 7 x weekly - 2 sets - 10 reps  ASSESSMENT:  CLINICAL IMPRESSION: Patient a 63 y.o. y.o. female who was seen today for physical therapy evaluation and treatment for Adhesive capsulitis of left shoulder. Patient presents with pain limited deficits in L shoulder strength, ROM, endurance, activity tolerance, and functional mobility with ADL. Patient is having to modify and restrict ADL as indicated by outcome measure score as well as subjective information and objective measures which is affecting overall participation. Patient will benefit from skilled physical therapy in order to improve function and reduce impairment.  OBJECTIVE IMPAIRMENTS: decreased activity tolerance, decreased endurance, decreased mobility, decreased ROM, decreased strength, increased muscle spasms, impaired flexibility, impaired UE functional use, improper body mechanics, and pain  ACTIVITY LIMITATIONS:  carrying, lifting, bending, sleeping, bed mobility, reach over head, hygiene/grooming, and caring for others  PARTICIPATION LIMITATIONS:  meal prep, cleaning, laundry, driving, shopping,  community activity, occupation, and yard work  PERSONAL FACTORS: Time since onset of injury/illness/exacerbation and 1-2 comorbidities: HLD, adhesive capsulitis, upcoming breathing surgery are also affecting patient's functional outcome.   REHAB POTENTIAL: Good  CLINICAL DECISION MAKING: Evolving/moderate complexity  EVALUATION COMPLEXITY: Moderate   GOALS: Goals reviewed with patient? Yes  SHORT TERM GOALS: Target date: 07/11/2024    Patient will be independent with HEP in order to improve functional outcomes. Baseline: Goal status: INITIAL  2.  Patient will report at least 25% improvement in symptoms for improved quality of life. Baseline:  Goal status: INITIAL   LONG TERM GOALS: Target date: 08/08/2024    Patient will report at least 75% improvement in symptoms for improved quality of life. Baseline:  Goal status: INITIAL  2.  Patient will improve UEFS score by at least 18 points in order to indicate improved tolerance to activity. Baseline: 16/80 Goal status: INITIAL  3.  Patient will demonstrate at least 155 in L shoulder AROM in flexion for improved ability lift overhead. Baseline:  Goal status: INITIAL  4.  Patient will be able to return to all activities unrestricted for improved ability to perform work functions and participate with family.  Baseline:  Goal status: INITIAL  5.  Patient will demonstrate grade of 4+/5 MMT grade in all tested musculature as evidence of improved strength to assist with lifting at home. Baseline:  Goal status: INITIAL    PLAN:  PT FREQUENCY: 2x/week  PT DURATION: 8 weeks  PLANNED INTERVENTIONS:[id number]- PT Re-evaluation, 97110-Therapeutic exercises, 97530- Therapeutic activity, [id number]- Neuromuscular re-education, 97535- Self Care, 02859- Manual therapy, [id number]- Gait training, [phone]- Orthotic Fit/training, [phone]- Canalith repositioning, [id number]- Aquatic Therapy, 97760- Splinting, [id number]- Wound care (first 20 sq cm), 02401- Wound  care (each additional 20 sq cm)Patient/Family education, Balance training, Stair training, Taping, Dry Needling, Joint  mobilization, Joint manipulation, Spinal manipulation, Spinal mobilization, Scar mobilization, and DME instructions.  PLAN FOR NEXT SESSION: Manual for pain/mobility, L shoulder ROM and strengthening   Prentice GORMAN Stains, PT, DPT 06/13/2024, 11:14 AM

## 2024-06-17 ENCOUNTER — Ambulatory Visit (HOSPITAL_BASED_OUTPATIENT_CLINIC_OR_DEPARTMENT_OTHER): Admitting: Physical Therapy

## 2024-06-17 ENCOUNTER — Encounter (HOSPITAL_BASED_OUTPATIENT_CLINIC_OR_DEPARTMENT_OTHER): Payer: Self-pay | Admitting: Physical Therapy

## 2024-06-17 DIAGNOSIS — R29898 Other symptoms and signs involving the musculoskeletal system: Secondary | ICD-10-CM

## 2024-06-17 DIAGNOSIS — M25612 Stiffness of left shoulder, not elsewhere classified: Secondary | ICD-10-CM | POA: Diagnosis not present

## 2024-06-17 DIAGNOSIS — M25512 Pain in left shoulder: Secondary | ICD-10-CM

## 2024-06-17 DIAGNOSIS — M6281 Muscle weakness (generalized): Secondary | ICD-10-CM

## 2024-06-17 NOTE — Therapy (Signed)
 OUTPATIENT PHYSICAL THERAPY SHOULDER EVALUATION   Patient Name: AVRIELLE FRY MRN: [id number] DOB:1961-05-25, 63 y.o., female Today's Date: 06/17/2024  END OF SESSION:  PT End of Session - 06/17/24 1723     Visit Number 2    Number of Visits 16    Date for PT Re-Evaluation 08/08/24    [facility]ization Type [facility]    Authorization Time Period 15 visits approved (preliminary) 05/19/24-09/16/24    PT Start Time 1600    PT Stop Time 1640    PT Time Calculation (min) 40 min    Activity Tolerance Patient tolerated treatment well    Behavior During Therapy [facility] for tasks assessed/performed           Past Medical History:  Diagnosis Date   Anxiety    Asthma    Carpal tunnel syndrome of left wrist 12/20/2012   Depression    GERD (gastroesophageal reflux disease)    History of foot surgery    2 surgeries on left foot, 3 on right foot   Mental disorder    anger issues   Nerve damage    hands   Plantar fasciitis    bilateral   Seizures (HCC)    last sz >20 yrs ago   Sleep apnea    does not use CPAP   Past Surgical History:  Procedure Laterality Date   BUNIONECTOMY     CARPAL TUNNEL RELEASE Left 12/26/2012   Procedure: CARPAL TUNNEL RELEASE;  Surgeon: Arley JONELLE Curia, MD;  Location: [facility];  Service: Orthopedics;  Laterality: Left;   CARPAL TUNNEL RELEASE Right 01/12/2013   Procedure: Right Carpal Tunnel Release;  Surgeon: Arley JONELLE Curia, MD;  Location: [facility];  Service: Orthopedics;  Laterality: Right;   DRUG INDUCED ENDOSCOPY N/A 04/10/2024   Procedure: DRUG INDUCED SLEEP ENDOSCOPY;  Surgeon: Tobie Eldora NOVAK, MD;  Location: [facility];  Service: ENT;  Laterality: N/A;   ELBOW SURGERY Left    HAMMER TOE SURGERY     PLANTAR FASCIA RELEASE     Patient Active Problem List   Diagnosis Date Noted   OSA (obstructive sleep apnea) 04/10/2024   Intolerance of continuous positive airway pressure (CPAP) ventilation 04/10/2024    [facility]  REFERRING PROVIDER: Canda Franky Jurist, MD  REFERRING DIAG: M75.02 (ICD-10-CM) - Adhesive capsulitis of left shoulder  THERAPY DIAG:  Stiffness of left shoulder, not elsewhere classified  Left shoulder pain, unspecified chronicity  Muscle weakness (generalized)  Other symptoms and signs involving the musculoskeletal system  Rationale for Evaluation and Treatment: Rehabilitation  ONSET DATE: DOS 05/18/24  SUBJECTIVE:  SUBJECTIVE STATEMENT: Patient has been doing her exercises. She feels like her shoulder is much better.   Eval: Patient states L  shoulder surgery. She had an arthroscopic lysis of adhesions and manipulation under anesthesia with open distal clavicle excision on 05/18/24. Having surgery for a device to help her breath on 06/29/24. Minimal ability to use L arm currently.  Hand dominance: Right  PERTINENT HISTORY: HDL, adhesive capsulitis which began about 3-4 months ago  PAIN:  Are you having pain? Yes: NPRS scale: 8/10 Pain location: L shoulder Pain description: sharp Aggravating factors: movement Relieving factors: pain meds, ice  PRECAUTIONS: None  RED FLAGS: None   WEIGHT BEARING RESTRICTIONS: No  FALLS:  Has patient fallen in last 6 months? No  OCCUPATION: Retired   PLOF: Independent  PATIENT GOALS:good movement  OBJECTIVE: (objective measures from initial evaluation unless otherwise dated)  PATIENT SURVEYS:  UEFS  Extreme difficulty/unable (0), Quite a bit of difficulty (1), Moderate difficulty (2), Little difficulty (3), No difficulty (4) Survey date:  06/13/24  Any of your usual work, household or school activities 0  2. Your usual hobbies, recreational/sport activities 0   3. Lifting a bag of groceries to waist level 1   4. Lifting a bag of groceries  above your head 1  5. Grooming your hair 1  6. Pushing up on your hands (I.e. from bathtub or chair) 2  7. Preparing food (I.e. peeling/cutting) 1  8. Driving  0  9. Vacuuming, sweeping, or raking 0  10. Dressing  0  11. Doing up buttons 1  12. Using tools/appliances 0  13. Opening doors 3  14. Cleaning  1  15. Tying or lacing shoes 1  16. Sleeping  1  17. Laundering clothes (I.e. washing, ironing, folding) 1  18. Opening a jar 1  19. Throwing a ball 1  20. Carrying a small suitcase with your affected limb.  1  Score total:  16/80     COGNITION: Overall cognitive status: Within functional limits for tasks assessed     SENSATION: WFL  POSTURE: rounded shoulders, forward head, and increased thoracic kyphosis   UPPER EXTREMITY ROM: PROM: flexion: 123 degree, ER 25  Active ROM Right eval Left eval  Shoulder flexion 145 90*  Shoulder extension    Shoulder abduction 145 68*  Shoulder adduction    Shoulder internal rotation (functional) T8 L5  Shoulder external rotation (functional) T4 occiput  Elbow flexion    Elbow extension    Wrist flexion    Wrist extension    Wrist ulnar deviation    Wrist radial deviation    Wrist pronation    Wrist supination    (Blank rows = not tested) *=pain/symptoms  UPPER EXTREMITY MMT:  MMT Right eval Left eval  Shoulder flexion 4 3+*  Shoulder extension    Shoulder abduction 4+ 3+*  Shoulder adduction    Shoulder internal rotation 4+ 4-*  Shoulder external rotation 4 4-*  Middle trapezius    Lower trapezius    Elbow flexion 5 5  Elbow extension 5 5  Wrist flexion    Wrist extension    Wrist ulnar deviation    Wrist radial deviation    Wrist pronation    Wrist supination    Grip strength (lbs)    (Blank rows = not tested) *=pain/symptoms   JOINT MOBILITY TESTING:  Hypomobile GH  PALPATION:  No TTP L shoulder   TODAY'S TREATMENT:  DATE:  8/27 Manual:  Trigger point release to upper trap  Grade I and II inferior and posterior glides  ER with distraction  Flexion stretch with distraction   There-ex  Wand press 2x12 Wand flexion 2x12 Active ER/IR 3x10   Nero-re-ed:  Row with cuing for posture 3x12 red  Shoulder extension with cuing for posture 3x12 red   Given for HEP    06/13/24 Supine AAROM flexion with SPC x 5 Supine AAROM ER with SPC x 5  PATIENT EDUCATION:  Education details: Patient educated on exam findings, POC, scope of PT, HEP, relevant anatomy/biomechanics. Person educated: Patient Education method: Explanation, Demonstration, and Handouts Education comprehension: verbalized understanding, returned demonstration, verbal cues required, and tactile cues required  HOME EXERCISE PROGRAM: Access Code: [email or web address] URL: [email or web address] Date: 06/13/2024 Prepared by: Prentice Zaunegger  Exercises - Supine Shoulder Flexion Extension AAROM with Dowel  - 3 x daily - 7 x weekly - 2 sets - 10 reps - Supine Shoulder External Rotation with Dowel  - 3 x daily - 7 x weekly - 2 sets - 10 reps - Shoulder Flexion Wall Slide with Towel (Mirrored)  - 3 x daily - 7 x weekly - 2 sets - 10 reps  ASSESSMENT:  CLINICAL IMPRESSION: The patient tolerated treatment well. She still has limitations in ER but they improved with manual therapy Seh was given active ER today. She was also given scpaular and postural strengthening for her home program. She had no increase in pain. She is very motivated to get her left arm stronger. Therapy will continue to progress as tolerated.   Eval: Patient a 63 y.o. y.o. female who was seen today for physical therapy evaluation and treatment for Adhesive capsulitis of left shoulder. Patient presents with pain limited deficits in L shoulder strength, ROM, endurance, activity tolerance, and functional mobility with ADL.  Patient is having to modify and restrict ADL as indicated by outcome measure score as well as subjective information and objective measures which is affecting overall participation. Patient will benefit from skilled physical therapy in order to improve function and reduce impairment.  OBJECTIVE IMPAIRMENTS: decreased activity tolerance, decreased endurance, decreased mobility, decreased ROM, decreased strength, increased muscle spasms, impaired flexibility, impaired UE functional use, improper body mechanics, and pain  ACTIVITY LIMITATIONS:  carrying, lifting, bending, sleeping, bed mobility, reach over head, hygiene/grooming, and caring for others  PARTICIPATION LIMITATIONS:  meal prep, cleaning, laundry, driving, shopping, community activity, occupation, and yard work  PERSONAL FACTORS: Time since onset of injury/illness/exacerbation and 1-2 comorbidities: HLD, adhesive capsulitis, upcoming breathing surgery are also affecting patient's functional outcome.   REHAB POTENTIAL: Good  CLINICAL DECISION MAKING: Evolving/moderate complexity  EVALUATION COMPLEXITY: Moderate   GOALS: Goals reviewed with patient? Yes  SHORT TERM GOALS: Target date: 07/11/2024    Patient will be independent with HEP in order to improve functional outcomes. Baseline: Goal status: INITIAL  2.  Patient will report at least 25% improvement in symptoms for improved quality of life. Baseline:  Goal status: INITIAL   LONG TERM GOALS: Target date: 08/08/2024    Patient will report at least 75% improvement in symptoms for improved quality of life. Baseline:  Goal status: INITIAL  2.  Patient will improve UEFS score by at least 18 points in order to indicate improved tolerance to activity. Baseline: 16/80 Goal status: INITIAL  3.  Patient will demonstrate at least 155 in L shoulder AROM in flexion for improved ability lift overhead. Baseline:  Goal status: INITIAL  4.  Patient will be able to return to  all activities unrestricted for improved ability to perform work functions and participate with family.  Baseline:  Goal status: INITIAL  5.  Patient will demonstrate grade of 4+/5 MMT grade in all tested musculature as evidence of improved strength to assist with lifting at home. Baseline:  Goal status: INITIAL    PLAN:  PT FREQUENCY: 2x/week  PT DURATION: 8 weeks  PLANNED INTERVENTIONS:97164- PT Re-evaluation, 97110-Therapeutic exercises, 97530- Therapeutic activity, [id number]- Neuromuscular re-education, 97535- Self Care, 02859- Manual therapy, [phone]- Gait training, [phone]- Orthotic Fit/training, [phone]- Canalith repositioning, [id number]- Aquatic Therapy, [phone]- Splinting, [phone]- Wound care (first 20 sq cm), 97598- Wound care (each additional 20 sq cm)Patient/Family education, Balance training, Stair training, Taping, Dry Needling, Joint mobilization, Joint manipulation, Spinal manipulation, Spinal mobilization, Scar mobilization, and DME instructions.  PLAN FOR NEXT SESSION: Manual for pain/mobility, L shoulder ROM and strengthening   Alm JINNY Don, PT, DPT 06/17/2024, 5:24 PM

## 2024-06-18 ENCOUNTER — Encounter (HOSPITAL_BASED_OUTPATIENT_CLINIC_OR_DEPARTMENT_OTHER): Payer: Self-pay | Admitting: Physical Therapy

## 2024-06-19 ENCOUNTER — Encounter (HOSPITAL_BASED_OUTPATIENT_CLINIC_OR_DEPARTMENT_OTHER): Payer: Self-pay | Admitting: *Deleted

## 2024-06-19 ENCOUNTER — Other Ambulatory Visit: Payer: Self-pay

## 2024-06-24 ENCOUNTER — Encounter (HOSPITAL_BASED_OUTPATIENT_CLINIC_OR_DEPARTMENT_OTHER): Payer: Self-pay

## 2024-06-24 ENCOUNTER — Ambulatory Visit (HOSPITAL_BASED_OUTPATIENT_CLINIC_OR_DEPARTMENT_OTHER): Attending: Orthopedic Surgery

## 2024-06-24 DIAGNOSIS — R29898 Other symptoms and signs involving the musculoskeletal system: Secondary | ICD-10-CM | POA: Insufficient documentation

## 2024-06-24 DIAGNOSIS — M6281 Muscle weakness (generalized): Secondary | ICD-10-CM | POA: Diagnosis present

## 2024-06-24 DIAGNOSIS — M25612 Stiffness of left shoulder, not elsewhere classified: Secondary | ICD-10-CM | POA: Insufficient documentation

## 2024-06-24 DIAGNOSIS — M25512 Pain in left shoulder: Secondary | ICD-10-CM | POA: Diagnosis present

## 2024-06-24 NOTE — Therapy (Signed)
 OUTPATIENT PHYSICAL THERAPY SHOULDER  TREATMENT   Patient Name: Alyssa Schwartz MRN: [id number] DOB:1961/06/21, 63 y.o., female Today's Date: 06/24/2024  END OF SESSION:  PT End of Session - 06/24/24 1416     Visit Number 3    Number of Visits 16    Date for PT Re-Evaluation 08/08/24    [place]ization Type [facility]    Authorization Time Period 15 visits approved (preliminary) 05/19/24-09/16/24    Authorization - Visit Number 2    Authorization - Number of Visits 15    PT Start Time 1431    PT Stop Time 1515    PT Time Calculation (min) 44 min    Activity Tolerance Patient tolerated treatment well    Behavior During Therapy WFL for tasks assessed/performed            Past Medical History:  Diagnosis Date   Anxiety    Asthma    Carpal tunnel syndrome of left wrist 12/20/2012   Depression    GERD (gastroesophageal reflux disease)    History of foot surgery    2 surgeries on left foot, 3 on right foot   Mental disorder    anger issues   Nerve damage    hands   Plantar fasciitis    bilateral   PONV (postoperative nausea and vomiting)    Seizures (HCC)    last sz >20 yrs ago   Sleep apnea    does not use CPAP   Past Surgical History:  Procedure Laterality Date   BUNIONECTOMY     CARPAL TUNNEL RELEASE Left 12/26/2012   Procedure: CARPAL TUNNEL RELEASE;  Surgeon: Arley JONELLE Curia, MD;  Location: [facility];  Service: Orthopedics;  Laterality: Left;   CARPAL TUNNEL RELEASE Right 01/12/2013   Procedure: Right Carpal Tunnel Release;  Surgeon: Arley JONELLE Curia, MD;  Location: [facility];  Service: Orthopedics;  Laterality: Right;   DRUG INDUCED ENDOSCOPY N/A 04/10/2024   Procedure: DRUG INDUCED SLEEP ENDOSCOPY;  Surgeon: Tobie Eldora NOVAK, MD;  Location: [facility];  Service: ENT;  Laterality: N/A;   ELBOW SURGERY Left    HAMMER TOE SURGERY     PLANTAR FASCIA RELEASE     Patient Active Problem List   Diagnosis Date Noted   OSA (obstructive sleep  apnea) 04/10/2024   Intolerance of continuous positive airway pressure (CPAP) ventilation 04/10/2024    [facility]  REFERRING PROVIDER: Canda Franky Jurist, MD  REFERRING DIAG: M75.02 (ICD-10-CM) - Adhesive capsulitis of left shoulder  THERAPY DIAG:  Stiffness of left shoulder, not elsewhere classified  Left shoulder pain, unspecified chronicity  Muscle weakness (generalized)  Other symptoms and signs involving the musculoskeletal system  Rationale for Evaluation and Treatment: Rehabilitation  ONSET DATE: DOS 05/18/24  SUBJECTIVE:  SUBJECTIVE STATEMENT: Pt reports 7/10 pain level at entry. It's coming along.  Eval: Patient states L  shoulder surgery. She had an arthroscopic lysis of adhesions and manipulation under anesthesia with open distal clavicle excision on 05/18/24. Having surgery for a device to help her breath on 06/29/24. Minimal ability to use L arm currently.  Hand dominance: Right  PERTINENT HISTORY: HDL, adhesive capsulitis which began about 3-4 months ago  PAIN:  Are you having pain? Yes: NPRS scale: 7/10 Pain location: L shoulder Pain description: sharp Aggravating factors: movement Relieving factors: pain meds, ice  PRECAUTIONS: None  RED FLAGS: None   WEIGHT BEARING RESTRICTIONS: No  FALLS:  Has patient fallen in last 6 months? No  OCCUPATION: Retired   PLOF: Independent  PATIENT GOALS:good movement  OBJECTIVE: (objective measures from initial evaluation unless otherwise dated)  PATIENT SURVEYS:  UEFS  Extreme difficulty/unable (0), Quite a bit of difficulty (1), Moderate difficulty (2), Little difficulty (3), No difficulty (4) Survey date:  06/13/24  Any of your usual work, household or school activities 0  2. Your usual hobbies, recreational/sport activities  0   3. Lifting a bag of groceries to waist level 1   4. Lifting a bag of groceries above your head 1  5. Grooming your hair 1  6. Pushing up on your hands (I.e. from bathtub or chair) 2  7. Preparing food (I.e. peeling/cutting) 1  8. Driving  0  9. Vacuuming, sweeping, or raking 0  10. Dressing  0  11. Doing up buttons 1  12. Using tools/appliances 0  13. Opening doors 3  14. Cleaning  1  15. Tying or lacing shoes 1  16. Sleeping  1  17. Laundering clothes (I.e. washing, ironing, folding) 1  18. Opening a jar 1  19. Throwing a ball 1  20. Carrying a small suitcase with your affected limb.  1  Score total:  16/80     COGNITION: Overall cognitive status: Within functional limits for tasks assessed     SENSATION: WFL  POSTURE: rounded shoulders, forward head, and increased thoracic kyphosis   UPPER EXTREMITY ROM: PROM: flexion: 123 degree, ER 25  Active ROM Right eval Left eval Left 9/3  Shoulder flexion 145 90* 130deg  Shoulder extension     Shoulder abduction 145 68*   Shoulder adduction     Shoulder internal rotation (functional) T8 L5   Shoulder external rotation (functional) T4 occiput   Elbow flexion     Elbow extension     Wrist flexion     Wrist extension     Wrist ulnar deviation     Wrist radial deviation     Wrist pronation     Wrist supination     (Blank rows = not tested) *=pain/symptoms  UPPER EXTREMITY MMT:  MMT Right eval Left eval  Shoulder flexion 4 3+*  Shoulder extension    Shoulder abduction 4+ 3+*  Shoulder adduction    Shoulder internal rotation 4+ 4-*  Shoulder external rotation 4 4-*  Middle trapezius    Lower trapezius    Elbow flexion 5 5  Elbow extension 5 5  Wrist flexion    Wrist extension    Wrist ulnar deviation    Wrist radial deviation    Wrist pronation    Wrist supination    Grip strength (lbs)    (Blank rows = not tested) *=pain/symptoms   JOINT MOBILITY TESTING:  Hypomobile GH  PALPATION:  No TTP L  shoulder  TODAY'S TREATMENT:                                                                                                                                         DATE:   06/24/24 PROM GHJ mobilizations Supine wand flexion 2x10 Theraband row GTB x20 Finger ladder flexion and abduction x10ea IR behind back stretching 5 x10    8/27 Manual:  Trigger point release to upper trap  Grade I and II inferior and posterior glides  ER with distraction  Flexion stretch with distraction   There-ex  Wand press 2x12 Wand flexion 2x12 Active ER/IR 3x10   Nero-re-ed:  Row with cuing for posture 3x12 red  Shoulder extension with cuing for posture 3x12 red   Given for HEP    06/13/24 Supine AAROM flexion with SPC x 5 Supine AAROM ER with SPC x 5  PATIENT EDUCATION:  Education details: Patient educated on exam findings, POC, scope of PT, HEP, relevant anatomy/biomechanics. Person educated: Patient Education method: Explanation, Demonstration, and Handouts Education comprehension: verbalized understanding, returned demonstration, verbal cues required, and tactile cues required  HOME EXERCISE PROGRAM: Access Code: [email or web address] URL: [email or web address] Date: 06/13/2024 Prepared by: Prentice Zaunegger  Exercises - Supine Shoulder Flexion Extension AAROM with Dowel  - 3 x daily - 7 x weekly - 2 sets - 10 reps - Supine Shoulder External Rotation with Dowel  - 3 x daily - 7 x weekly - 2 sets - 10 reps - Shoulder Flexion Wall Slide with Towel (Mirrored)  - 3 x daily - 7 x weekly - 2 sets - 10 reps  ASSESSMENT:  CLINICAL IMPRESSION: Continued to work on improving ROM and strength in clinic. Measured at 130deg AAROM flexion today, a significant improvement from IE. Performed GHJ mobilizations with inferior and posterior glides to improve joint motion. Good performance with finger ladder AAROM in both flexion and abduction. Will continue to progress as tolerated.    Eval: Patient a 63 y.o. y.o. female who was seen today for physical therapy evaluation and treatment for Adhesive capsulitis of left shoulder. Patient presents with pain limited deficits in L shoulder strength, ROM, endurance, activity tolerance, and functional mobility with ADL. Patient is having to modify and restrict ADL as indicated by outcome measure score as well as subjective information and objective measures which is affecting overall participation. Patient will benefit from skilled physical therapy in order to improve function and reduce impairment.  OBJECTIVE IMPAIRMENTS: decreased activity tolerance, decreased endurance, decreased mobility, decreased ROM, decreased strength, increased muscle spasms, impaired flexibility, impaired UE functional use, improper body mechanics, and pain  ACTIVITY LIMITATIONS:  carrying, lifting, bending, sleeping, bed mobility, reach over head, hygiene/grooming, and caring for others  PARTICIPATION LIMITATIONS:  meal prep, cleaning, laundry, driving, shopping, community activity, occupation, and yard work  PERSONAL FACTORS: Time since onset of injury/illness/exacerbation and 1-2 comorbidities: HLD, adhesive capsulitis, upcoming breathing surgery are also affecting  patient's functional outcome.   REHAB POTENTIAL: Good  CLINICAL DECISION MAKING: Evolving/moderate complexity  EVALUATION COMPLEXITY: Moderate   GOALS: Goals reviewed with patient? Yes  SHORT TERM GOALS: Target date: 07/11/2024    Patient will be independent with HEP in order to improve functional outcomes. Baseline: Goal status: INITIAL  2.  Patient will report at least 25% improvement in symptoms for improved quality of life. Baseline:  Goal status: INITIAL   LONG TERM GOALS: Target date: 08/08/2024    Patient will report at least 75% improvement in symptoms for improved quality of life. Baseline:  Goal status: INITIAL  2.  Patient will improve UEFS score by at least 18  points in order to indicate improved tolerance to activity. Baseline: 16/80 Goal status: INITIAL  3.  Patient will demonstrate at least 155 in L shoulder AROM in flexion for improved ability lift overhead. Baseline:  Goal status: INITIAL  4.  Patient will be able to return to all activities unrestricted for improved ability to perform work functions and participate with family.  Baseline:  Goal status: INITIAL  5.  Patient will demonstrate grade of 4+/5 MMT grade in all tested musculature as evidence of improved strength to assist with lifting at home. Baseline:  Goal status: INITIAL    PLAN:  PT FREQUENCY: 2x/week  PT DURATION: 8 weeks  PLANNED INTERVENTIONS:97164- PT Re-evaluation, 97110-Therapeutic exercises, 97530- Therapeutic activity, [id number]- Neuromuscular re-education, 97535- Self Care, [id number]- Manual therapy, [phone]- Gait training, [phone]- Orthotic Fit/training, [phone]- Canalith repositioning, [id number]- Aquatic Therapy, [phone]- Splinting, [phone]- Wound care (first 20 sq cm), 97598- Wound care (each additional 20 sq cm)Patient/Family education, Balance training, Stair training, Taping, Dry Needling, Joint mobilization, Joint manipulation, Spinal manipulation, Spinal mobilization, Scar mobilization, and DME instructions.  PLAN FOR NEXT SESSION: Manual for pain/mobility, L shoulder ROM and strengthening   Asberry FORBES Rodes, PTA 06/24/2024, 4:54 PM

## 2024-06-27 ENCOUNTER — Encounter (HOSPITAL_BASED_OUTPATIENT_CLINIC_OR_DEPARTMENT_OTHER): Payer: Self-pay

## 2024-06-27 ENCOUNTER — Ambulatory Visit (HOSPITAL_BASED_OUTPATIENT_CLINIC_OR_DEPARTMENT_OTHER)

## 2024-06-27 DIAGNOSIS — M6281 Muscle weakness (generalized): Secondary | ICD-10-CM

## 2024-06-27 DIAGNOSIS — M25512 Pain in left shoulder: Secondary | ICD-10-CM

## 2024-06-27 DIAGNOSIS — M25612 Stiffness of left shoulder, not elsewhere classified: Secondary | ICD-10-CM | POA: Diagnosis not present

## 2024-06-27 DIAGNOSIS — R29898 Other symptoms and signs involving the musculoskeletal system: Secondary | ICD-10-CM

## 2024-06-27 NOTE — Therapy (Signed)
 OUTPATIENT PHYSICAL THERAPY SHOULDER  TREATMENT   Patient Name: Alyssa Schwartz MRN: [id number] DOB:[birth year], 62 y.o., female Today's Date: 06/27/2024  END OF SESSION:  PT End of Session - 06/27/24 0950     Visit Number 4    Number of Visits 16    Date for PT Re-Evaluation 08/08/24    [facility]ization Type [facility]    Authorization Time Period 15 visits approved (preliminary) 05/19/24-09/16/24    Authorization - Visit Number 3    Authorization - Number of Visits 15    PT Start Time 1000    PT Stop Time 1045    PT Time Calculation (min) 45 min    Activity Tolerance Patient tolerated treatment well    Behavior During Therapy WFL for tasks assessed/performed             Past Medical History:  Diagnosis Date   Anxiety    Asthma    Carpal tunnel syndrome of left wrist 12/20/2012   Depression    GERD (gastroesophageal reflux disease)    History of foot surgery    2 surgeries on left foot, 3 on right foot   Mental disorder    anger issues   Nerve damage    hands   Plantar fasciitis    bilateral   PONV (postoperative nausea and vomiting)    Seizures (HCC)    last sz >20 yrs ago   Sleep apnea    does not use CPAP   Past Surgical History:  Procedure Laterality Date   BUNIONECTOMY     CARPAL TUNNEL RELEASE Left 12/26/2012   Procedure: CARPAL TUNNEL RELEASE;  Surgeon: Arley JONELLE Curia, MD;  Location: [facility];  Service: Orthopedics;  Laterality: Left;   CARPAL TUNNEL RELEASE Right 01/12/2013   Procedure: Right Carpal Tunnel Release;  Surgeon: Arley JONELLE Curia, MD;  Location: [facility];  Service: Orthopedics;  Laterality: Right;   DRUG INDUCED ENDOSCOPY N/A 04/10/2024   Procedure: DRUG INDUCED SLEEP ENDOSCOPY;  Surgeon: Tobie Eldora NOVAK, MD;  [facility];  Service: ENT;  Laterality: N/A;   ELBOW SURGERY Left    HAMMER TOE SURGERY     PLANTAR FASCIA RELEASE     Patient Active Problem List   Diagnosis Date Noted   OSA (obstructive sleep  apnea) 04/10/2024   Intolerance of continuous positive airway pressure (CPAP) ventilation 04/10/2024    [facility]  REFERRING PROVIDER: Canda Franky Jurist, MD  REFERRING DIAG: M75.02 (ICD-10-CM) - Adhesive capsulitis of left shoulder  THERAPY DIAG:  Stiffness of left shoulder, not elsewhere classified  Left shoulder pain, unspecified chronicity  Muscle weakness (generalized)  Other symptoms and signs involving the musculoskeletal system  Rationale for Evaluation and Treatment: Rehabilitation  ONSET DATE: DOS 05/18/24  SUBJECTIVE:  SUBJECTIVE STATEMENT: Pt reports her shoulder pain is 6-7/10. Denies pain/soreness following last session.   Eval: Patient states L  shoulder surgery. She had an arthroscopic lysis of adhesions and manipulation under anesthesia with open distal clavicle excision on 05/18/24. Having surgery for a device to help her breath on 06/29/24. Minimal ability to use L arm currently.  Hand dominance: Right  PERTINENT HISTORY: HDL, adhesive capsulitis which began about 3-4 months ago  PAIN:  Are you having pain? Yes: NPRS scale: 7/10 Pain location: L shoulder Pain description: sharp Aggravating factors: movement Relieving factors: pain meds, ice  PRECAUTIONS: None  RED FLAGS: None   WEIGHT BEARING RESTRICTIONS: No  FALLS:  Has patient fallen in last 6 months? No  OCCUPATION: Retired   PLOF: Independent  PATIENT GOALS:good movement  OBJECTIVE: (objective measures from initial evaluation unless otherwise dated)  PATIENT SURVEYS:  UEFS  Extreme difficulty/unable (0), Quite a bit of difficulty (1), Moderate difficulty (2), Little difficulty (3), No difficulty (4) Survey date:  06/13/24  Any of your usual work, household or school activities 0  2. Your usual hobbies,  recreational/sport activities 0   3. Lifting a bag of groceries to waist level 1   4. Lifting a bag of groceries above your head 1  5. Grooming your hair 1  6. Pushing up on your hands (I.e. from bathtub or chair) 2  7. Preparing food (I.e. peeling/cutting) 1  8. Driving  0  9. Vacuuming, sweeping, or raking 0  10. Dressing  0  11. Doing up buttons 1  12. Using tools/appliances 0  13. Opening doors 3  14. Cleaning  1  15. Tying or lacing shoes 1  16. Sleeping  1  17. Laundering clothes (I.e. washing, ironing, folding) 1  18. Opening a jar 1  19. Throwing a ball 1  20. Carrying a small suitcase with your affected limb.  1  Score total:  16/80     COGNITION: Overall cognitive status: Within functional limits for tasks assessed     SENSATION: WFL  POSTURE: rounded shoulders, forward head, and increased thoracic kyphosis   UPPER EXTREMITY ROM: PROM: flexion: 123 degree, ER 25  Active ROM Right eval Left eval Left 9/3  Shoulder flexion 145 90* 130deg  Shoulder extension     Shoulder abduction 145 68*   Shoulder adduction     Shoulder internal rotation (functional) T8 L5   Shoulder external rotation (functional) T4 occiput   Elbow flexion     Elbow extension     Wrist flexion     Wrist extension     Wrist ulnar deviation     Wrist radial deviation     Wrist pronation     Wrist supination     (Blank rows = not tested) *=pain/symptoms  UPPER EXTREMITY MMT:  MMT Right eval Left eval  Shoulder flexion 4 3+*  Shoulder extension    Shoulder abduction 4+ 3+*  Shoulder adduction    Shoulder internal rotation 4+ 4-*  Shoulder external rotation 4 4-*  Middle trapezius    Lower trapezius    Elbow flexion 5 5  Elbow extension 5 5  Wrist flexion    Wrist extension    Wrist ulnar deviation    Wrist radial deviation    Wrist pronation    Wrist supination    Grip strength (lbs)    (Blank rows = not tested) *=pain/symptoms   JOINT MOBILITY TESTING:  Hypomobile  GH  PALPATION:  No TTP  L shoulder   TODAY'S TREATMENT:                                                                                                                                         DATE:   06/27/24 PROM GHJ mobilizations Supine wand flexion 2x10 3seconds S/l ER 2x10 Theraband row GTB 2x15 Finger ladder flexion and abduction x5ea IR behind back stretching 5 x20  Standing AROM (to assess visually)   06/24/24 PROM GHJ mobilizations Supine wand flexion 2x10 Theraband row GTB x20 Finger ladder flexion and abduction x10ea IR behind back stretching 5 x10    8/27 Manual:  Trigger point release to upper trap  Grade I and II inferior and posterior glides  ER with distraction  Flexion stretch with distraction   There-ex  Wand press 2x12 Wand flexion 2x12 Active ER/IR 3x10   Nero-re-ed:  Row with cuing for posture 3x12 red  Shoulder extension with cuing for posture 3x12 red   Given for HEP    06/13/24 Supine AAROM flexion with SPC x 5 Supine AAROM ER with SPC x 5  PATIENT EDUCATION:  Education details: Patient educated on exam findings, POC, scope of PT, HEP, relevant anatomy/biomechanics. Person educated: Patient Education method: Explanation, Demonstration, and Handouts Education comprehension: verbalized understanding, returned demonstration, verbal cues required, and tactile cues required  HOME EXERCISE PROGRAM: Access Code: [email or web address] URL: [email or web address] Date: 06/13/2024 Prepared by: Prentice Zaunegger  Exercises - Supine Shoulder Flexion Extension AAROM with Dowel  - 3 x daily - 7 x weekly - 2 sets - 10 reps - Supine Shoulder External Rotation with Dowel  - 3 x daily - 7 x weekly - 2 sets - 10 reps - Shoulder Flexion Wall Slide with Towel (Mirrored)  - 3 x daily - 7 x weekly - 2 sets - 10 reps  ASSESSMENT:  CLINICAL IMPRESSION: Continued to work on improving ROM and strength in clinic. She felt relief and improved mobility  following GHJ mobilizations.  Remains tight into end ranges of each plane. Good tolerance with all tasks. Able to reach higher on finger ladder today. Reports compliance with HEP.  Will continue to progress as tolerated.   Eval: Patient a 64 y.o. y.o. female who was seen today for physical therapy evaluation and treatment for Adhesive capsulitis of left shoulder. Patient presents with pain limited deficits in L shoulder strength, ROM, endurance, activity tolerance, and functional mobility with ADL. Patient is having to modify and restrict ADL as indicated by outcome measure score as well as subjective information and objective measures which is affecting overall participation. Patient will benefit from skilled physical therapy in order to improve function and reduce impairment.  OBJECTIVE IMPAIRMENTS: decreased activity tolerance, decreased endurance, decreased mobility, decreased ROM, decreased strength, increased muscle spasms, impaired flexibility, impaired UE functional use, improper body mechanics, and pain  ACTIVITY LIMITATIONS:  carrying, lifting, bending, sleeping, bed mobility, reach over  head, hygiene/grooming, and caring for others  PARTICIPATION LIMITATIONS:  meal prep, cleaning, laundry, driving, shopping, community activity, occupation, and yard work  PERSONAL FACTORS: Time since onset of injury/illness/exacerbation and 1-2 comorbidities: HLD, adhesive capsulitis, upcoming breathing surgery are also affecting patient's functional outcome.   REHAB POTENTIAL: Good  CLINICAL DECISION MAKING: Evolving/moderate complexity  EVALUATION COMPLEXITY: Moderate   GOALS: Goals reviewed with patient? Yes  SHORT TERM GOALS: Target date: 07/11/2024    Patient will be independent with HEP in order to improve functional outcomes. Baseline: Goal status: MET 06/27/24  2.  Patient will report at least 25% improvement in symptoms for improved quality of life. Baseline:  Goal status:  INITIAL   LONG TERM GOALS: Target date: 08/08/2024    Patient will report at least 75% improvement in symptoms for improved quality of life. Baseline:  Goal status: INITIAL  2.  Patient will improve UEFS score by at least 18 points in order to indicate improved tolerance to activity. Baseline: 16/80 Goal status: INITIAL  3.  Patient will demonstrate at least 155 in L shoulder AROM in flexion for improved ability lift overhead. Baseline:  Goal status: INITIAL  4.  Patient will be able to return to all activities unrestricted for improved ability to perform work functions and participate with family.  Baseline:  Goal status: INITIAL  5.  Patient will demonstrate grade of 4+/5 MMT grade in all tested musculature as evidence of improved strength to assist with lifting at home. Baseline:  Goal status: INITIAL    PLAN:  PT FREQUENCY: 2x/week  PT DURATION: 8 weeks  PLANNED INTERVENTIONS:97164- PT Re-evaluation, 97110-Therapeutic exercises, 97530- Therapeutic activity, [id number]- Neuromuscular re-education, 97535- Self Care, 02859- Manual therapy, [phone]- Gait training, [phone]- Orthotic Fit/training, [phone]- Canalith repositioning, [id number]- Aquatic Therapy, [phone]- Splinting, [phone]- Wound care (first 20 sq cm), 97598- Wound care (each additional 20 sq cm)Patient/Family education, Balance training, Stair training, Taping, Dry Needling, Joint mobilization, Joint manipulation, Spinal manipulation, Spinal mobilization, Scar mobilization, and DME instructions.  PLAN FOR NEXT SESSION: Manual for pain/mobility, L shoulder ROM and strengthening   Asberry BRAVO Jacqueline Spofford, PTA 06/27/2024, 11:47 AM

## 2024-06-29 ENCOUNTER — Encounter (HOSPITAL_BASED_OUTPATIENT_CLINIC_OR_DEPARTMENT_OTHER): Payer: Self-pay

## 2024-06-29 ENCOUNTER — Ambulatory Visit (HOSPITAL_BASED_OUTPATIENT_CLINIC_OR_DEPARTMENT_OTHER)
Admission: RE | Admit: 2024-06-29 | Discharge: 2024-06-29 | Disposition: A | Discharge status: Home / Self Care | Attending: Otolaryngology | Admitting: Otolaryngology

## 2024-06-29 ENCOUNTER — Encounter (HOSPITAL_BASED_OUTPATIENT_CLINIC_OR_DEPARTMENT_OTHER)
Admission: RE | Disposition: A | Discharge status: Home / Self Care | Payer: Self-pay | Source: Home / Self Care | Attending: Otolaryngology

## 2024-06-29 ENCOUNTER — Other Ambulatory Visit: Payer: Self-pay

## 2024-06-29 ENCOUNTER — Ambulatory Visit (HOSPITAL_COMMUNITY)

## 2024-06-29 ENCOUNTER — Ambulatory Visit (HOSPITAL_BASED_OUTPATIENT_CLINIC_OR_DEPARTMENT_OTHER): Admitting: Anesthesiology

## 2024-06-29 DIAGNOSIS — G4733 Obstructive sleep apnea (adult) (pediatric): Secondary | ICD-10-CM

## 2024-06-29 DIAGNOSIS — Z91198 Patient's noncompliance with other medical treatment and regimen for other reason: Secondary | ICD-10-CM | POA: Diagnosis not present

## 2024-06-29 DIAGNOSIS — Z01818 Encounter for other preprocedural examination: Secondary | ICD-10-CM

## 2024-06-29 DIAGNOSIS — F17200 Nicotine dependence, unspecified, uncomplicated: Secondary | ICD-10-CM | POA: Diagnosis not present

## 2024-06-29 DIAGNOSIS — Z6826 Body mass index (BMI) 26.0-26.9, adult: Secondary | ICD-10-CM

## 2024-06-29 HISTORY — DX: Other specified postprocedural states: Z98.890

## 2024-06-29 HISTORY — PX: IMPLANTATION OF HYPOGLOSSAL NERVE STIMULATOR: SHX6827

## 2024-06-29 SURGERY — INSERTION, HYPOGLOSSAL NERVE STIMULATOR
Anesthesia: General | Site: Neck | Laterality: Right

## 2024-06-29 MED ORDER — DEXAMETHASONE SODIUM PHOSPHATE 4 MG/ML IJ SOLN
INTRAMUSCULAR | Status: DC | PRN
  Administered 2024-06-29: 10 mg via INTRAVENOUS

## 2024-06-29 MED ORDER — IBUPROFEN 200 MG PO TABS: 400.0000 mg | ORAL_TABLET | Freq: Four times a day (QID) | ORAL | 2 refills | Status: AC | PRN

## 2024-06-29 MED ORDER — LIDOCAINE-EPINEPHRINE 1 %-1:100000 IJ SOLN
INTRAMUSCULAR | Status: DC | PRN
  Administered 2024-06-29: 7.5 mL

## 2024-06-29 MED ORDER — LACTATED RINGERS IV SOLN: INTRAVENOUS | Status: DC

## 2024-06-29 MED ORDER — ACETAMINOPHEN 10 MG/ML IV SOLN
INTRAVENOUS | Status: AC
  Filled 2024-06-29: qty 100

## 2024-06-29 MED ORDER — ACETAMINOPHEN 10 MG/ML IV SOLN
INTRAVENOUS | Status: DC | PRN
  Administered 2024-06-29: 1000 mg via INTRAVENOUS

## 2024-06-29 MED ORDER — FENTANYL CITRATE (PF) 100 MCG/2ML IJ SOLN
INTRAMUSCULAR | Status: AC
  Filled 2024-06-29: qty 2

## 2024-06-29 MED ORDER — DEXMEDETOMIDINE HCL IN NACL 80 MCG/20ML IV SOLN
INTRAVENOUS | Status: DC | PRN
  Administered 2024-06-29: 8 ug via INTRAVENOUS
  Administered 2024-06-29 (×2): 4 ug via INTRAVENOUS

## 2024-06-29 MED ORDER — 0.9 % SODIUM CHLORIDE (POUR BTL) OPTIME
TOPICAL | Status: DC | PRN
  Administered 2024-06-29: 100 mL

## 2024-06-29 MED ORDER — ONDANSETRON HCL 4 MG/2ML IJ SOLN
INTRAMUSCULAR | Status: DC | PRN
  Administered 2024-06-29: 4 mg via INTRAVENOUS

## 2024-06-29 MED ORDER — FENTANYL CITRATE (PF) 100 MCG/2ML IJ SOLN
INTRAMUSCULAR | Status: DC | PRN
  Administered 2024-06-29 (×4): 50 ug via INTRAVENOUS

## 2024-06-29 MED ORDER — CEFAZOLIN SODIUM-DEXTROSE 2-3 GM-%(50ML) IV SOLR
INTRAVENOUS | Status: DC | PRN
  Administered 2024-06-29: 2 g via INTRAVENOUS

## 2024-06-29 MED ORDER — PROPOFOL 10 MG/ML IV BOLUS
INTRAVENOUS | Status: DC | PRN
  Administered 2024-06-29 (×3): 50 mg via INTRAVENOUS
  Administered 2024-06-29: 150 mg via INTRAVENOUS
  Administered 2024-06-29: 50 mg via INTRAVENOUS
  Administered 2024-06-29 (×2): 30 mg via INTRAVENOUS

## 2024-06-29 MED ORDER — FENTANYL CITRATE (PF) 100 MCG/2ML IJ SOLN
INTRAMUSCULAR | Status: AC
Start: 2024-06-29 — End: 2024-06-29
  Filled 2024-06-29: qty 2

## 2024-06-29 MED ORDER — PHENYLEPHRINE HCL-NACL 20-0.9 MG/250ML-% IV SOLN
INTRAVENOUS | Status: DC | PRN
  Administered 2024-06-29: 50 ug/min via INTRAVENOUS

## 2024-06-29 MED ORDER — PROPOFOL 10 MG/ML IV BOLUS
INTRAVENOUS | Status: AC
  Filled 2024-06-29: qty 20

## 2024-06-29 MED ORDER — OXYCODONE HCL 5 MG PO TABS
ORAL_TABLET | ORAL | Status: AC
  Filled 2024-06-29: qty 1

## 2024-06-29 MED ORDER — PROPOFOL 500 MG/50ML IV EMUL
INTRAVENOUS | Status: DC | PRN
  Administered 2024-06-29: 200 ug/kg/min via INTRAVENOUS

## 2024-06-29 MED ORDER — ONDANSETRON HCL 4 MG/2ML IJ SOLN
INTRAMUSCULAR | Status: AC
  Filled 2024-06-29: qty 2

## 2024-06-29 MED ORDER — FENTANYL CITRATE (PF) 100 MCG/2ML IJ SOLN
25.0000 ug | INTRAMUSCULAR | Status: DC | PRN
  Administered 2024-06-29 (×4): 50 ug via INTRAVENOUS

## 2024-06-29 MED ORDER — CEPHALEXIN 500 MG PO CAPS: 500.0000 mg | ORAL_CAPSULE | Freq: Two times a day (BID) | ORAL | 0 refills | Status: AC

## 2024-06-29 MED ORDER — PROPOFOL 10 MG/ML IV BOLUS
INTRAVENOUS | Status: AC
Start: 2024-06-29 — End: 2024-06-29
  Filled 2024-06-29: qty 20

## 2024-06-29 MED ORDER — MIDAZOLAM HCL 2 MG/2ML IJ SOLN
INTRAMUSCULAR | Status: AC
Start: 2024-06-29 — End: 2024-06-29
  Filled 2024-06-29: qty 2

## 2024-06-29 MED ORDER — OXYCODONE HCL 5 MG PO TABS: 5.0000 mg | ORAL_TABLET | Freq: Four times a day (QID) | ORAL | 0 refills | Status: AC | PRN

## 2024-06-29 MED ORDER — OXYCODONE HCL 5 MG/5ML PO SOLN: 5.0000 mg | Freq: Once | ORAL | Status: AC | PRN

## 2024-06-29 MED ORDER — LIDOCAINE 2% (20 MG/ML) 5 ML SYRINGE
INTRAMUSCULAR | Status: DC | PRN
  Administered 2024-06-29: 100 mg via INTRAVENOUS

## 2024-06-29 MED ORDER — OXYCODONE HCL 5 MG PO TABS
5.0000 mg | ORAL_TABLET | Freq: Once | ORAL | Status: AC | PRN
  Administered 2024-06-29: 5 mg via ORAL

## 2024-06-29 MED ORDER — SUCCINYLCHOLINE CHLORIDE 200 MG/10ML IV SOSY
PREFILLED_SYRINGE | INTRAVENOUS | Status: DC | PRN
  Administered 2024-06-29: 100 mg via INTRAVENOUS

## 2024-06-29 MED ORDER — ACETAMINOPHEN 10 MG/ML IV SOLN: 1000.0000 mg | Freq: Once | INTRAVENOUS | Status: DC | PRN

## 2024-06-29 MED ORDER — SODIUM CHLORIDE 0.9 % IV SOLN
INTRAVENOUS | Status: DC | PRN
  Administered 2024-06-29: 500 mL

## 2024-06-29 MED ORDER — ACETAMINOPHEN 500 MG PO TABS: 1000.0000 mg | ORAL_TABLET | Freq: Four times a day (QID) | ORAL | 0 refills | Status: AC

## 2024-06-29 MED ORDER — DEXAMETHASONE SODIUM PHOSPHATE 10 MG/ML IJ SOLN
INTRAMUSCULAR | Status: AC
  Filled 2024-06-29: qty 1

## 2024-06-29 MED ORDER — ONDANSETRON HCL 4 MG/2ML IJ SOLN: 4.0000 mg | Freq: Once | INTRAMUSCULAR | Status: DC | PRN

## 2024-06-29 SURGICAL SUPPLY — 70 items
BLADE CLIPPER SURG (BLADE) IMPLANT
BLADE SURG 15 STRL LF DISP TIS (BLADE) ×1 IMPLANT
CANISTER SUCT 1200ML W/VALVE (MISCELLANEOUS) ×1 IMPLANT
CHLORAPREP W/TINT 26 (MISCELLANEOUS) ×2 IMPLANT
CLIP TI WIDE RED SMALL 6 (CLIP) IMPLANT
CORD BIPOLAR FORCEPS 12FT (ELECTRODE) ×1 IMPLANT
COVER PROBE CYLINDRICAL 5X96 (MISCELLANEOUS) ×1 IMPLANT
DERMABOND ADVANCED .7 DNX12 (GAUZE/BANDAGES/DRESSINGS) ×2 IMPLANT
DRAPE C-ARM 35X43 STRL (DRAPES) ×1 IMPLANT
DRAPE HEAD BAR (DRAPES) IMPLANT
DRAPE INCISE IOBAN 66X45 STRL (DRAPES) ×1 IMPLANT
DRAPE MICROSCOPE WILD 40.5X102 (DRAPES) IMPLANT
DRAPE UTILITY XL STRL (DRAPES) ×1 IMPLANT
DRSG TEGADERM 2-3/8X2-3/4 SM (GAUZE/BANDAGES/DRESSINGS) ×2 IMPLANT
DRSG TEGADERM 4X4.75 (GAUZE/BANDAGES/DRESSINGS) ×1 IMPLANT
ELECT COATED BLADE 2.86 ST (ELECTRODE) ×1 IMPLANT
ELECTRODE EMG 18 NIMS (NEUROSURGERY SUPPLIES) ×1 IMPLANT
ELECTRODE REM PT RTRN 9FT ADLT (ELECTROSURGICAL) ×1 IMPLANT
FORCEPS BIPOLAR SPETZLER 8 1.0 (NEUROSURGERY SUPPLIES) ×1 IMPLANT
GAUZE 4X4 16PLY ~~LOC~~+RFID DBL (SPONGE) ×1 IMPLANT
GAUZE SPONGE 4X4 12PLY STRL (GAUZE/BANDAGES/DRESSINGS) ×1 IMPLANT
GENERATOR PULSE INSPIRE (Generator) ×1 IMPLANT
GENERATOR PULSE INSPIRE IV (Generator) ×1 IMPLANT
GLOVE BIO SURGEON STRL SZ 6.5 (GLOVE) ×1 IMPLANT
GLOVE BIO SURGEON STRL SZ7.5 (GLOVE) ×1 IMPLANT
GLOVE BIOGEL PI IND STRL 7.0 (GLOVE) IMPLANT
GLOVE BIOGEL PI IND STRL 7.5 (GLOVE) IMPLANT
GLOVE BIOGEL PI MICRO STRL 6.5 (GLOVE) IMPLANT
GLOVE SURG SS PI 7.0 STRL IVOR (GLOVE) IMPLANT
GOWN STRL REUS W/ TWL LRG LVL3 (GOWN DISPOSABLE) ×3 IMPLANT
GOWN STRL REUS W/ TWL XL LVL3 (GOWN DISPOSABLE) IMPLANT
HOOK RETRACT STAY BLUNT 12 (MISCELLANEOUS) IMPLANT
IV CATH 18G SAFETY (IV SOLUTION) ×1 IMPLANT
KIT NEURO ACCESSORY W/WRENCH (MISCELLANEOUS) IMPLANT
LEAD SENSING RESP INSPIRE (Lead) ×1 IMPLANT
LEAD SENSING RESP INSPIRE IV (Lead) ×1 IMPLANT
LEAD SLEEP STIM INSPIRE IV/V (Lead) ×1 IMPLANT
LEAD SLEEP STIMULATION INSPIRE (Lead) ×1 IMPLANT
LOOP VASCLR MAXI BLUE 18IN ST (MISCELLANEOUS) ×1 IMPLANT
LOOP VESSEL MINI RED (MISCELLANEOUS) ×1 IMPLANT
MARKER SKIN DUAL TIP RULER LAB (MISCELLANEOUS) ×1 IMPLANT
NDL HYPO 25X1 1.5 SAFETY (NEEDLE) ×1 IMPLANT
NEEDLE HYPO 25X1 1.5 SAFETY (NEEDLE) ×1 IMPLANT
NS IRRIG 1000ML POUR BTL (IV SOLUTION) ×1 IMPLANT
PACK BASIN DAY SURGERY FS (CUSTOM PROCEDURE TRAY) ×1 IMPLANT
PACK ENT DAY SURGERY (CUSTOM PROCEDURE TRAY) ×1 IMPLANT
PASSER CATH 36 CODMAN DISP (NEUROSURGERY SUPPLIES) IMPLANT
PASSER CATH 38CM DISP (INSTRUMENTS) IMPLANT
PENCIL SMOKE EVACUATOR (MISCELLANEOUS) ×1 IMPLANT
PROBE NERVE STIMULATOR (NEUROSURGERY SUPPLIES) ×1 IMPLANT
REMOTE CONTROL SLEEP INSPIRE (MISCELLANEOUS) ×1 IMPLANT
SET WALTER ACTIVATION W/DRAPE (SET/KITS/TRAYS/PACK) ×1 IMPLANT
SLEEVE SCD COMPRESS KNEE MED (STOCKING) ×1 IMPLANT
SPONGE INTESTINAL PEANUT (DISPOSABLE) ×1 IMPLANT
SUT MNCRL AB 4-0 PS2 18 (SUTURE) IMPLANT
SUT MON AB 5-0 PS2 18 (SUTURE) ×2 IMPLANT
SUT SILK 2 0 SH (SUTURE) IMPLANT
SUT SILK 2 0 SH CR/8 (SUTURE) ×2 IMPLANT
SUT SILK 3 0 RB1 (SUTURE) IMPLANT
SUT SILK 3 0 REEL (SUTURE) IMPLANT
SUT VIC AB 3-0 SH 27X BRD (SUTURE) IMPLANT
SUT VIC AB 4-0 PS2 27 (SUTURE) IMPLANT
SUT VIC AB 4-0 RB1 18 (SUTURE) ×2 IMPLANT
SUT VIC AB 4-0 RB1 27X BRD (SUTURE) IMPLANT
SUT VIC AB 4-0 SH 18 (SUTURE) IMPLANT
SUT VICRYL 3-0 CR8 SH (SUTURE) ×1 IMPLANT
SUTURE SILK 3-0 RB1 30XBRD (SUTURE) IMPLANT
SYR 10ML LL (SYRINGE) ×1 IMPLANT
SYR BULB EAR ULCER 3OZ GRN STR (SYRINGE) ×1 IMPLANT
TOWEL GREEN STERILE FF (TOWEL DISPOSABLE) ×2 IMPLANT

## 2024-06-29 NOTE — Op Note (Signed)
 Otolaryngology Operative note  Alyssa Schwartz Date/Time of Admission: 06/29/2024  6:32 AM  CSN: [id number];MRN:[id number]  DOB: 17-Mar-1961 Age: 63 y.o. Location: [facility]    Pre-Op Diagnosis: Moderate to Severe Obstructive Sleep Apnea (ICD-10 G47.33) Intolerance of Continuous Positive Airway Pressure Ventilation  Post-Op Diagnosis: Same  Procedure: Right 12th cranial nerve (hypoglossal) stimulation implant with placement of chest wall respiratory sensor (CPT [phone]).   Surgeon: Eldora Blanch, MD  Anesthesia type:  General  Anesthesiologist: CRNA: Julieanne Fairy BROCKS, CRNA   Staff: * No surgical staff found *  Implants: * No implants in log *  Specimens: None  EBL: 25cc  Drains: None  Post-op disposition and condition: PACU, hemodynamically stable  Findings:  Normal neck and chest anatomy with successful implantation of hypoglossal stimulator. Diagnostic evaluation confirmed an appropriate respiration sensing signal as well as activation of the genioglossus nerve, resulting in genioglossal activation and tongue protrusion, confirmed visually.   Complications: None apparent  Indications and consent:  Alyssa Schwartz is a 63 y.o. female with history of moderate to severe obstructive sleep apnea with a BMI of 26 and intolerance of continuous positive airway pressure ventilation therapy. Patient has passed the clinical, polysomnographic and endoscopic screening criteria for implantation. The patient's options were discussed, including risks/benefits/alternatives for each option. Patient expressed understanding, and despite these risks, consented and decided to proceed with above procedures. Informed consent was signed before proceeding.   Procedure: The patient was brought to the Operating Room and was anesthetized via general endotracheal anesthesia without complication.  A shoulder roll was placed and the patient was prepped and draped in usual sterile fashion with the head  turned to the left. Prior to prepping and draping, electrodes were placed in the genioglossus and hyoglossus muscle and connected to the NIM box for intraoperative nerve monitoring. Intraoperative antibiotics and steroids were administered.   1% lidocaine  with 1:100000 epinephrine  was injected in the planned and marked submandibular and chest incisions.  The patient was then prepped and draped in the standard fashion for this procedure.     A modified sub-mandibular incision was made in the right upper neck halfway between the hyoid bone and the inferior border of the mandible, about 1cm lateral from midline and about 5cm in length. Dissection was carried down through the subcutaneous tissue and platysma. The inferior border of the submandibular gland was identified as well as the digastric tendon. The submandibular gland and the overlying fascia with the marginal mandibular nerve were retracted superiorly and laterally.  The digastric tendon was retracted inferiorly using two vessel loops. Dissection was carried down under the mylohyoid muscle where the hypoglossal nerve was identified in its usual fashion.    The mylohyoid muscle was retracted anteriorly, and the hypoglossal nerve was dissected medially. The lateral branches to retrusor muscles were identified, and tested intra-operatively using the bipolar NIM stimulator. The superior/posterior branches innervating the hyoglossus muscle were identified using the NIM stimulator and anatomical cues.  The cuff electrode for the hypoglossal nerve stimulator was placed distally to these branches innervating the genioglossus, transverse, and vertical muscles. C1 branch was included. The stimulation lead was anchored to the digastric tendon using two 3-0 silk sutures. The wound bed was then irrigated with antibiotic irrigation. The lead body slack between the cuff and the anchor was gently tucked deep to the submandibular gland.   A second 5 cm incision was made  in the right upper chest over the second intercostal space, approximately 3cm lateral to the  sternal margin. Dissection was carried down through the skin and subcutaneous tissue to the fascia of the pectoralis muscle. An inferior pocket for the generator was created deep to the subcutaneous layer and superficial to the fascia of the pectoralis muscle.  The pectoralis major fascia was dissected directly over the second intercostal space with subsequent blunt dissection through the muscle.  The pectoralis major and minor were then retracted to expose the fatty layer just superficial to the external intercostal muscles.  The fatty layer was carefully swept away to expose the external intercostal muscles. A throw-down base knot was placed to the fascia of the external intercostals just lateral to the anterior external membrane using 3-0 silk suture.  The external intercostals were gently dissected, approximately 5 mm lateral to the suture knot and the respiratory sense lead was advanced with the sensor facing the pleura into the interfascial plane between the external and internal intercostals. The primary anchor was sutured into place with 3-0 silk on the external intercostals.  The secondary anchor was sutured with 3-0 silk to the pectoralis major with some slack between the anchors.   The stimulation lead was then tunneled in a subplatysmal plane with blunt dissection under direct visualization and brought out into the sub-clavicular pocket where both the stimulation lead and the respiratory sensing lead were connected to the implantable pulse generator.   The implantable pulse generator was placed in the subclavicular pocket, ensuring the lead body was deep to the generator and secured with use of air knots to the pectoralis fascia using 2-0 silk sutures.  Diagnostic evaluation confirmed good placement of the stimulation cuff as demonstrated by activation of the genioglossus and transverse and vertical muscles,  resulting in tongue protrusion, confirmed visually. Diagnostic evaluation also confirmed good respiratory sensor placement as demonstrated by a sensing waveform with rise and fall associated with patient respirations.   The wound bed was then irrigated with antibiotic irrigation and hemostasis was noted adequate.   The chest and neck wounds were then closed in three layers with deep 3-0 and 4-0 vicryl sutures and a 5-0 monocryl (subcuticular) followed by dermabond for skin. The patient was then awakened, extubated, and transferred to Recovery Room in stable condition. Post-operative xrays were obtained in the recovery room

## 2024-06-29 NOTE — Transfer of Care (Signed)
 Immediate Anesthesia Transfer of Care Note  Patient: Alyssa Schwartz  Procedure(s) Performed: RIGHT HYPOGLOSSAL NERVE STIMULATOR PLACEMENT (Right: Neck)  Patient Location: PACU  Anesthesia Type:General  Level of Consciousness: awake, alert , and oriented  Airway & Oxygen Therapy: Patient Spontanous Breathing and Patient connected to face mask oxygen  Post-op Assessment: Report given to RN and Post -op Vital signs reviewed and stable  Post vital signs: Reviewed and stable  Last Vitals:  Vitals Value Taken Time  BP 121/72 06/29/24 11:24  Temp    Pulse 78 06/29/24 11:28  Resp 18 06/29/24 11:28  SpO2 100 % 06/29/24 11:28  Vitals shown include unfiled device data.  Last Pain:  Vitals:   06/29/24 0701  TempSrc: Temporal  PainSc: 6       Patients Stated Pain Goal: 3 (06/29/24 0701)  Complications: No notable events documented.

## 2024-06-29 NOTE — Anesthesia Postprocedure Evaluation (Signed)
 Anesthesia Post Note  Patient: Alyssa Schwartz  Procedure(s) Performed: RIGHT HYPOGLOSSAL NERVE STIMULATOR PLACEMENT (Right: Neck)     Patient location during evaluation: PACU Anesthesia Type: General Level of consciousness: awake and alert Pain management: pain level controlled Vital Signs Assessment: post-procedure vital signs reviewed and stable Respiratory status: spontaneous breathing, nonlabored ventilation, respiratory function stable and patient connected to nasal cannula oxygen Cardiovascular status: blood pressure returned to baseline and stable Postop Assessment: no apparent nausea or vomiting Anesthetic complications: no   No notable events documented.  Last Vitals:  Vitals:   06/29/24 1230 06/29/24 1245  BP: 131/80 136/88  Pulse: 71 74  Resp: 16 16  Temp:    SpO2: 96% 98%    Last Pain:  Vitals:   06/29/24 1250  TempSrc:   PainSc: 7                  Rome Ade

## 2024-06-29 NOTE — Discharge Instructions (Addendum)
 Post Anesthesia Guidelines  During recovery from anesthesia  You may feel drowsy and reflexes may be slowed for 24 hours:  -Do not drive, use machinery, appliances, ride bicycles or scooters  -Do not consume alcohol  -Do not make important decisions   Eating and drinking:  -Drink plenty of liquids today  -Avoid fried or spicy foods today  -Return to your regular diet slowly over the next 24 hours  -Please call if unable to keep fluids down  If your throat is sore: -A breathing tube may have been used during your surgery -Drink cool liquids or gargle with warm salt water -If soreness lasts more than a few days, please call us   Surgery Discharge Instructions:  Call clinic or return to ED if you: - develop a fever greater than 101.4 - have shaking chills or are feeling ill - become short of breath - have uncontrollable nausea or vomiting - can't hold down food or liquids or feel as though you are getting dehydrated - have significant leakage or drainage from wound - urine output of less than 30cc/hr for 12 hours - develop significant redness, pain at incision(s) or wound opens up/separates - any other acute events, problems, or concerns  Wound Care/Dressings/Drain Instructions:  - To take care of your incision/cuts on neck and chest  - Your incision is closed with skin glue. It will peel off on its own in 1-2 weeks. You can let water run over it in 48 hours like in a shower and gently pat dry. Avoid rubbing the incision or submerging under like like a bath or swimming pool. - Your stitches are absorbable and will dissolve on their own  - Starting in a few days, do gentle neck rolls (5 rolls twice per day) to prevent any tethering of the neck - Avoid raising or rotating your right arm much for first 7-10 days  Medications: - Resume your regular home medications except as detailed in the medication reconciliation.  - For pain, take tylenol  1000mg  every 6 hours and ibuprofen   400mg  every 6 hours as needed. If that is not sufficient, a stronger pain medication has been prescribed to you (oxycodone  5mg  tablet every 4-6 hours). Do not mix with any other narcotic medication such as the tramadol  you were prescribed recently. - Take 500mg  tablet of keflex  twice daily for 7 days to prevent infection.  Follow Up:  - A follow up appointment should be scheduled for you after discharge. However, If you do not hear about your appoinment in 3 business days, please call the provided surgery clinic number and confirm/schedule your appointment.    Activity/Restrictions:  - Resume your regular activities, as tolerated. Avoid heavy lifting or straining (more than 5 lbs) for 10 days.  Diet: - Resume your regular diet, as tolerated  Additional Instructions: - Please take an over the counter stool softener while taking narcotic pain medication - DO NOT MIX NARCOTIC PAIN MEDICATIONS OR TAKE NARCOTIC PRESCRIPTIONS AT THE SAME TIME - DO NOT DRIVE OR OPERATE HEAVY MACHINERY WHILE ON NARCOTICS  - DO NOT TAKE MORE THAN 4 GRAMS (4000mg ) OF TYLENOL  (ACETAMINOPHEN ) IN 24 HOURS  ENT  Office Contact Info: Otolaryngology Front Desk Phone including questions about appointments or questions: [phone] - If after normal business hours (Monday-Friday after 5PM or Weekends/Holidays), please call same number and follow prompts for Patient Access Line. There is a physician on call for urgent matters. For life threatening emergencies, please call 911    Post Anesthesia Home  Care Instructions  Activity: Get plenty of rest for the remainder of the day. A responsible individual must stay with you for 24 hours following the procedure.  For the next 24 hours, DO NOT: -Drive a car -Advertising copywriter -Drink alcoholic beverages -Take any medication unless instructed by your physician -Make any legal decisions or sign important papers.  Meals: Start with liquid foods such as gelatin or soup.  Progress to regular foods as tolerated. Avoid greasy, spicy, heavy foods. If nausea and/or vomiting occur, drink only clear liquids until the nausea and/or vomiting subsides. Call your physician if vomiting continues.  Special Instructions/Symptoms: Your throat may feel dry or sore from the anesthesia or the breathing tube placed in your throat during surgery. If this causes discomfort, gargle with warm salt water. The discomfort should disappear within 24 hours.  If you had a scopolamine patch placed behind your ear for the management of post- operative nausea and/or vomiting:  1. The medication in the patch is effective for 72 hours, after which it should be removed.  Wrap patch in a tissue and discard in the trash. Wash hands thoroughly with soap and water. 2. You may remove the patch earlier than 72 hours if you experience unpleasant side effects which may include dry mouth, dizziness or visual disturbances. 3. Avoid touching the patch. Wash your hands with soap and water after contact with the patch.

## 2024-06-29 NOTE — Anesthesia Preprocedure Evaluation (Signed)
 Anesthesia Evaluation  Patient identified by MRN, date of birth, ID band Patient awake    Reviewed: Allergy & Precautions, NPO status , Patient's Chart, lab work & pertinent test results  History of Anesthesia Complications (+) PONV and history of anesthetic complications  Airway Mallampati: III  TM Distance: >3 FB Neck ROM: Full    Dental no notable dental hx. (+) Teeth Intact   Pulmonary neg pulmonary ROS, asthma , sleep apnea , neg COPD, Current Smoker and Patient abstained from smoking.   Pulmonary exam normal breath sounds clear to auscultation       Cardiovascular Exercise Tolerance: Good METS(-) hypertension(-) CAD and (-) Past MI negative cardio ROS (-) dysrhythmias  Rhythm:Regular Rate:Normal - Systolic murmurs    Neuro/Psych  PSYCHIATRIC DISORDERS Anxiety Depression    negative neurological ROS     GI/Hepatic ,GERD  Controlled,,(+)     (-) substance abuse    Endo/Other  neg diabetes    Renal/GU negative Renal ROS     Musculoskeletal   Abdominal   Peds  Hematology   Anesthesia Other Findings Past Medical History: No date: Anxiety No date: Asthma 12/20/2012: Carpal tunnel syndrome of left wrist No date: Depression No date: GERD (gastroesophageal reflux disease) No date: History of foot surgery     Comment:  2 surgeries on left foot, 3 on right foot No date: Mental disorder     Comment:  anger issues No date: Nerve damage     Comment:  hands No date: Plantar fasciitis     Comment:  bilateral No date: PONV (postoperative nausea and vomiting) No date: Seizures (HCC)     Comment:  last sz >20 yrs ago No date: Sleep apnea     Comment:  does not use CPAP  Reproductive/Obstetrics                              Anesthesia Physical Anesthesia Plan  ASA: 2  Anesthesia Plan: General   Post-op Pain Management: Ofirmev  IV (intra-op)* and Toradol  IV (intra-op)*   Induction:  Intravenous  PONV Risk Score and Plan: 4 or greater and Ondansetron , Dexamethasone , Midazolam , TIVA and Propofol  infusion  Airway Management Planned: Oral ETT  Additional Equipment: None  Intra-op Plan:   Post-operative Plan: Extubation in OR  Informed Consent: I have reviewed the patients History and Physical, chart, labs and discussed the procedure including the risks, benefits and alternatives for the proposed anesthesia with the patient or authorized representative who has indicated his/her understanding and acceptance.     Dental advisory given  Plan Discussed with: CRNA and Surgeon  Anesthesia Plan Comments: (Discussed risks of anesthesia with patient, including PONV, sore throat, lip/dental/eye damage. Rare risks discussed as well, such as cardiorespiratory and neurological sequelae, and allergic reactions. Discussed the role of CRNA in patient's perioperative care. Patient understands. Patient counseled on benefits of smoking cessation, and increased perioperative risks associated with continued smoking. )        Anesthesia Quick Evaluation

## 2024-06-29 NOTE — Anesthesia Procedure Notes (Signed)
 Procedure Name: Intubation Date/Time: 06/29/2024 8:48 AM  Performed by: Julieanne Fairy BROCKS, CRNAPre-anesthesia Checklist: Patient identified, Emergency Drugs available, Suction available and Patient being monitored Patient Re-evaluated:Patient Re-evaluated prior to induction Oxygen Delivery Method: Circle system utilized Preoxygenation: Pre-oxygenation with 100% oxygen Induction Type: IV induction Ventilation: Mask ventilation without difficulty Laryngoscope Size: Mac and 4 Grade View: Grade II Tube type: Oral Tube size: 7.5 mm Number of attempts: 1 Airway Equipment and Method: Stylet and Oral airway Placement Confirmation: ETT inserted through vocal cords under direct vision, positive ETCO2 and breath sounds checked- equal and bilateral Secured at: 22 cm Tube secured with: Tape Dental Injury: Teeth and Oropharynx as per pre-operative assessment

## 2024-06-29 NOTE — H&P (Signed)
 Pre-Operative H&P - Day Of Surgery Patient Name: Alyssa Schwartz Date:   06/29/2024  HPI: Alyssa Schwartz is a 63 y.o. female who presents today for operative treatment of obstructive sleep apnea, intolerance of continuous positive airway pressure ventilation. Patient denies recent significant changes to health or significant new medications or physiologic change in condition which would immediately impact plans. No new types of therapy has been initiated that would change the plan or the appropriateness of the plan.   ROS:  A complete review of systems was obtained and is otherwise negative.   PMH:  Past Medical History:  Diagnosis Date   Anxiety    Asthma    Carpal tunnel syndrome of left wrist 12/20/2012   Depression    GERD (gastroesophageal reflux disease)    History of foot surgery    2 surgeries on left foot, 3 on right foot   Mental disorder    anger issues   Nerve damage    hands   Plantar fasciitis    bilateral   PONV (postoperative nausea and vomiting)    Seizures (HCC)    last sz >20 yrs ago   Sleep apnea    does not use CPAP    PSH:  Past Surgical History:  Procedure Laterality Date   BUNIONECTOMY     CARPAL TUNNEL RELEASE Left 12/26/2012   Procedure: CARPAL TUNNEL RELEASE;  Surgeon: Arley JONELLE Curia, MD;  Location: [facility];  Service: Orthopedics;  Laterality: Left;   CARPAL TUNNEL RELEASE Right 01/12/2013   Procedure: Right Carpal Tunnel Release;  Surgeon: Arley JONELLE Curia, MD;  Location: [facility];  Service: Orthopedics;  Laterality: Right;   DRUG INDUCED ENDOSCOPY N/A 04/10/2024   Procedure: DRUG INDUCED SLEEP ENDOSCOPY;  Surgeon: Tobie Eldora NOVAK, MD;  Location:  SURGERY CENTER;  Service: ENT;  Laterality: N/A;   ELBOW SURGERY Left    HAMMER TOE SURGERY     PLANTAR FASCIA RELEASE      MEDS:   Current Facility-Administered Medications:    lactated ringers  infusion, , Intravenous, Continuous, Epifanio Charleston, MD  ALLERGIES: Patient  has no known allergies.  EXAM: Vitals: BP 125/86   Pulse 78   Temp 98.2 F (36.8 C) (Temporal)   Resp 16   Ht 5' 11 (1.803 m)   Wt 86.9 kg   SpO2 100%   BMI 26.72 kg/m   General Awake, at baseline alertness.   HEENT No scleral icterus or conjunctival hemorrhage. Globe position appears normal. External ears  normal. Nose patent without rhinorrhea. No lymphadenopathy. No thyromegaly  Cardiovascular No cyanosis.  Pulmonary No audible stridor. Breathing easily with no labor.  Neuro Symmetric facial movement.   Psychiatry Appropriate affect and mood.  Skin No scars or lesions on face or neck.  Extermities Moves all extremities with normal range of motion.   Other Findings None.   Assessment & Plan: Alyssa Schwartz has diagnoses of obstructive sleep apnea, intolerance of continuous positive airway pressure ventilation and will go to the OR today for right hypoglossal nerve stimulator placement. Informed consent was obtained and available in EMR today. All questions have been answered, and risks/benefits/alternatives of procedure as noted in the consent were discussed in a quiet area. Questions were invited and answered. The patient expressed understanding, provided consent and wished to proceed despite risks.  Eldora NOVAK Tobie 06/29/2024 7:33 AM

## 2024-06-30 ENCOUNTER — Encounter (HOSPITAL_BASED_OUTPATIENT_CLINIC_OR_DEPARTMENT_OTHER): Payer: Self-pay | Admitting: Otolaryngology

## 2024-07-13 ENCOUNTER — Encounter (INDEPENDENT_AMBULATORY_CARE_PROVIDER_SITE_OTHER): Admitting: Otolaryngology

## 2024-07-14 ENCOUNTER — Encounter (INDEPENDENT_AMBULATORY_CARE_PROVIDER_SITE_OTHER): Payer: Self-pay | Admitting: Otolaryngology

## 2024-07-14 ENCOUNTER — Ambulatory Visit (INDEPENDENT_AMBULATORY_CARE_PROVIDER_SITE_OTHER): Admitting: Otolaryngology

## 2024-07-14 VITALS — BP 135/80 | HR 95 | Ht 71.0 in

## 2024-07-14 DIAGNOSIS — Z789 Other specified health status: Secondary | ICD-10-CM

## 2024-07-14 DIAGNOSIS — G4733 Obstructive sleep apnea (adult) (pediatric): Secondary | ICD-10-CM

## 2024-07-14 DIAGNOSIS — Z9889 Other specified postprocedural states: Secondary | ICD-10-CM

## 2024-07-14 NOTE — Progress Notes (Signed)
 S/p Hypoglossal nerve stimulator placement on 06/29/2024 S: Doing well, pain well controlled; no fevers or issue with wound. No tongue weakness, no lip weakness. No SOB O: Tongue movement intact; no lower lip weakness; neck and chest incision c/d/I, no evidence of infection A/P: 63 y.o. y.o. w/ OSA s/p Hypoglossal nerve stimulator placement on 06/29/2024 Doing well, no evidence of infection Recommend continued gentle neck rolls F/u as needed - has appt with [facility] for activation in October

## 2024-07-29 ENCOUNTER — Ambulatory Visit (HOSPITAL_BASED_OUTPATIENT_CLINIC_OR_DEPARTMENT_OTHER): Attending: Orthopedic Surgery | Admitting: Physical Therapy

## 2024-07-29 ENCOUNTER — Telehealth (HOSPITAL_BASED_OUTPATIENT_CLINIC_OR_DEPARTMENT_OTHER): Payer: Self-pay | Admitting: Physical Therapy

## 2024-07-29 DIAGNOSIS — M25612 Stiffness of left shoulder, not elsewhere classified: Secondary | ICD-10-CM | POA: Insufficient documentation

## 2024-07-29 DIAGNOSIS — R29898 Other symptoms and signs involving the musculoskeletal system: Secondary | ICD-10-CM | POA: Insufficient documentation

## 2024-07-29 DIAGNOSIS — M6281 Muscle weakness (generalized): Secondary | ICD-10-CM | POA: Insufficient documentation

## 2024-07-29 DIAGNOSIS — M25512 Pain in left shoulder: Secondary | ICD-10-CM | POA: Insufficient documentation

## 2024-07-29 NOTE — Telephone Encounter (Signed)
 Patient no show, spoke with patient regarding missed appointment. Rescheduled for opening later this month.  1:53 PM, 07/29/24 Prentice CANDIE Stains PT, DPT Physical Therapist at [facility]

## 2024-08-12 ENCOUNTER — Ambulatory Visit (HOSPITAL_BASED_OUTPATIENT_CLINIC_OR_DEPARTMENT_OTHER): Payer: Self-pay | Admitting: Physical Therapy

## 2024-08-12 ENCOUNTER — Encounter (HOSPITAL_BASED_OUTPATIENT_CLINIC_OR_DEPARTMENT_OTHER): Payer: Self-pay | Admitting: Physical Therapy

## 2024-08-12 DIAGNOSIS — M25612 Stiffness of left shoulder, not elsewhere classified: Secondary | ICD-10-CM

## 2024-08-12 DIAGNOSIS — M6281 Muscle weakness (generalized): Secondary | ICD-10-CM | POA: Diagnosis present

## 2024-08-12 DIAGNOSIS — M25512 Pain in left shoulder: Secondary | ICD-10-CM

## 2024-08-12 DIAGNOSIS — R29898 Other symptoms and signs involving the musculoskeletal system: Secondary | ICD-10-CM

## 2024-08-12 NOTE — Therapy (Addendum)
 OUTPATIENT PHYSICAL THERAPY SHOULDER  TREATMENT/PROGRESS NOTE   Patient Name: Alyssa Schwartz MRN: [id number] DOB:Dec 24, 1960, 63 y.o., female Today's Date: 08/12/2024  END OF SESSION:  PT End of Session - 08/12/24 1344     Visit Number 5    Number of Visits 24    Date for Recertification  10/07/24    [place]ization Type [facility]    Authorization Time Period 15 visits approved (preliminary) 05/19/24-09/16/24    Authorization - Visit Number 4    Authorization - Number of Visits 15    PT Start Time 1345    PT Stop Time 1428    PT Time Calculation (min) 43 min    Activity Tolerance Patient tolerated treatment well    Behavior During Therapy WFL for tasks assessed/performed           Past Medical History:  Diagnosis Date   Anxiety    Asthma    Carpal tunnel syndrome of left wrist 12/20/2012   Depression    GERD (gastroesophageal reflux disease)    History of foot surgery    2 surgeries on left foot, 3 on right foot   Mental disorder    anger issues   Nerve damage    hands   Plantar fasciitis    bilateral   PONV (postoperative nausea and vomiting)    Seizures (HCC)    last sz >20 yrs ago   Sleep apnea    does not use CPAP   Past Surgical History:  Procedure Laterality Date   BUNIONECTOMY     CARPAL TUNNEL RELEASE Left 12/26/2012   Procedure: CARPAL TUNNEL RELEASE;  Surgeon: Arley JONELLE Curia, MD;  Location: [facility];  Service: Orthopedics;  Laterality: Left;   CARPAL TUNNEL RELEASE Right 01/12/2013   Procedure: Right Carpal Tunnel Release;  Surgeon: Arley JONELLE Curia, MD;  Location: [facility];  Service: Orthopedics;  Laterality: Right;   DRUG INDUCED ENDOSCOPY N/A 04/10/2024   Procedure: DRUG INDUCED SLEEP ENDOSCOPY;  Surgeon: Tobie Eldora NOVAK, MD;  Location: [facility];  Service: ENT;  Laterality: N/A;   ELBOW SURGERY Left    HAMMER TOE SURGERY     IMPLANTATION OF HYPOGLOSSAL NERVE STIMULATOR Right 06/29/2024   Procedure: RIGHT HYPOGLOSSAL  NERVE STIMULATOR PLACEMENT;  Surgeon: Tobie Eldora NOVAK, MD;  Location: [facility];  Service: ENT;  Laterality: Right;   PLANTAR FASCIA RELEASE     Patient Active Problem List   Diagnosis Date Noted   Obstructive sleep apnea (adult) (pediatric) 06/29/2024   OSA (obstructive sleep apnea) 04/10/2024   Intolerance of continuous positive airway pressure (CPAP) ventilation 04/10/2024   [facility]  REFERRING PROVIDER: Canda Franky Jurist, MD  REFERRING DIAG: M75.02 (ICD-10-CM) - Adhesive capsulitis of left shoulder  THERAPY DIAG:  Stiffness of left shoulder, not elsewhere classified  Left shoulder pain, unspecified chronicity  Muscle weakness (generalized)  Other symptoms and signs involving the musculoskeletal system  Rationale for Evaluation and Treatment: Rehabilitation  ONSET DATE: DOS 05/18/24  SUBJECTIVE:  SUBJECTIVE STATEMENT: Patient reports she is doing well. Shoulder has been bothering her a bit since she has been having to sleep on it due to sleep apnea monitor on right side.  Eval: Patient states L  shoulder surgery. She had an arthroscopic lysis of adhesions and manipulation under anesthesia with open distal clavicle excision on 05/18/24. Having surgery for a device to help her breath on 06/29/24. Minimal ability to use L arm currently.  Hand dominance: Right  PERTINENT HISTORY: HDL, adhesive capsulitis which began about 3-4 months ago  PAIN:  Are you having pain? Yes: NPRS scale: 7/10 Pain location: L shoulder Pain description: sharp Aggravating factors: movement Relieving factors: pain meds, ice  PRECAUTIONS: None  RED FLAGS: None   WEIGHT BEARING RESTRICTIONS: No  FALLS:  Has patient fallen in last 6 months? No  OCCUPATION: Retired   PLOF: Independent  PATIENT  GOALS:good movement  OBJECTIVE: (objective measures from initial evaluation unless otherwise dated)  PATIENT SURVEYS:  UEFS  Extreme difficulty/unable (0), Quite a bit of difficulty (1), Moderate difficulty (2), Little difficulty (3), No difficulty (4) Survey date:  06/13/24 08/12/24  Any of your usual work, household or school activities 0 2  2. Your usual hobbies, recreational/sport activities 0 2   3. Lifting a bag of groceries to waist level 1 3   4. Lifting a bag of groceries above your head 1 1  5. Grooming your hair 1 2  6. Pushing up on your hands (I.e. from bathtub or chair) 2 2  7. Preparing food (I.e. peeling/cutting) 1 3  8. Driving  0 2  9. Vacuuming, sweeping, or raking 0 3  10. Dressing  0 2  11. Doing up buttons 1 3  12. Using tools/appliances 0 3  13. Opening doors 3 3  14. Cleaning  1 3  15. Tying or lacing shoes 1 2  16. Sleeping  1 3  17. Laundering clothes (I.e. washing, ironing, folding) 1 2  18. Opening a jar 1 2  19. Throwing a ball 1 3  20. Carrying a small suitcase with your affected limb.  1 2  Score total:  16/80 48/80     COGNITION: Overall cognitive status: Within functional limits for tasks assessed     SENSATION: WFL  POSTURE: rounded shoulders, forward head, and increased thoracic kyphosis   UPPER EXTREMITY ROM: PROM: flexion: 123 degree, ER 25  Active ROM Right eval Left eval Left 9/3 Left 08/12/24  Shoulder flexion 145 90* 130deg 130 deg   Shoulder extension      Shoulder abduction 145 68*  120 deg   Shoulder adduction      Shoulder internal rotation (functional) T8 L5  T8  Shoulder external rotation (functional) T4 occiput  T1  Elbow flexion      Elbow extension      Wrist flexion      Wrist extension      Wrist ulnar deviation      Wrist radial deviation      Wrist pronation      Wrist supination      (Blank rows = not tested) *=pain/symptoms  UPPER EXTREMITY MMT:  MMT Right eval Left eval Right 08/12/24 Left  08/12/24  Shoulder flexion 4 3+* 4+ 4-*  Shoulder extension      Shoulder abduction 4+ 3+*    Shoulder adduction      Shoulder internal rotation 4+ 4-* 4+ 4-*  Shoulder external rotation 4 4-* 4+ 4+  Middle trapezius  Lower trapezius      Elbow flexion 5 5    Elbow extension 5 5    Wrist flexion      Wrist extension      Wrist ulnar deviation      Wrist radial deviation      Wrist pronation      Wrist supination      Grip strength (lbs)      (Blank rows = not tested) *=pain/symptoms   JOINT MOBILITY TESTING:  Hypomobile GH  PALPATION:  No TTP L shoulder   TODAY'S TREATMENT:                                                                                                                                         DATE:  08/12/24 NuStep x 6 min lvl 5 Reassessment  Shoulder abduction on ladder x10 with 3 second holds at the top Shoulder flexion on ladder x10 with 3 second holds  Ball wall circles 3x10 both directions  Shoulder flexion with perpendicular resistance RTB 2x10  06/27/24 PROM GHJ mobilizations Supine wand flexion 2x10 3seconds S/l ER 2x10 Theraband row GTB 2x15 Finger ladder flexion and abduction x5ea IR behind back stretching 5 x20  Standing AROM (to assess visually)  06/24/24 PROM GHJ mobilizations Supine wand flexion 2x10 Theraband row GTB x20 Finger ladder flexion and abduction x10ea IR behind back stretching 5 x10   8/27 Manual:  Trigger point release to upper trap  Grade I and II inferior and posterior glides  ER with distraction  Flexion stretch with distraction   There-ex  Wand press 2x12 Wand flexion 2x12 Active ER/IR 3x10   Nero-re-ed:  Row with cuing for posture 3x12 red  Shoulder extension with cuing for posture 3x12 red   Given for HEP    06/13/24 Supine AAROM flexion with SPC x 5 Supine AAROM ER with SPC x 5  PATIENT EDUCATION:  Education details: Patient educated on exam findings, POC, scope of PT, HEP, relevant  anatomy/biomechanics. Person educated: Patient Education method: Explanation, Demonstration, and Handouts Education comprehension: verbalized understanding, returned demonstration, verbal cues required, and tactile cues required  HOME EXERCISE PROGRAM: Access Code: [email or web address] URL: [email or web address] Date: 06/13/2024 Prepared by: Prentice Zaunegger  Exercises - Supine Shoulder Flexion Extension AAROM with Dowel  - 3 x daily - 7 x weekly - 2 sets - 10 reps - Supine Shoulder External Rotation with Dowel  - 3 x daily - 7 x weekly - 2 sets - 10 reps - Shoulder Flexion Wall Slide with Towel (Mirrored)  - 3 x daily - 7 x weekly - 2 sets - 10 reps  ASSESSMENT:  CLINICAL IMPRESSION: Patient has met 2/2 short term goals and 1/5 long term goals with ability to complete HEP and improvement in symptoms, strength, ROM, activity tolerance, gait, balance, and functional mobility. Remaining goals not met due to continued deficits in upper extremity strength, ROM,  activity tolerance, and functional mobility. Patient has made good progress toward remaining goals. Tolerated exercises well today and is continuing to improve. Patient will continue to benefit from skilled physical therapy in order to improve function and reduce impairment.   Eval: Patient a 63 y.o. y.o. female who was seen today for physical therapy evaluation and treatment for Adhesive capsulitis of left shoulder. Patient presents with pain limited deficits in L shoulder strength, ROM, endurance, activity tolerance, and functional mobility with ADL. Patient is having to modify and restrict ADL as indicated by outcome measure score as well as subjective information and objective measures which is affecting overall participation. Patient will benefit from skilled physical therapy in order to improve function and reduce impairment.  OBJECTIVE IMPAIRMENTS: decreased activity tolerance, decreased endurance, decreased mobility, decreased  ROM, decreased strength, increased muscle spasms, impaired flexibility, impaired UE functional use, improper body mechanics, and pain  ACTIVITY LIMITATIONS:  carrying, lifting, bending, sleeping, bed mobility, reach over head, hygiene/grooming, and caring for others  PARTICIPATION LIMITATIONS:  meal prep, cleaning, laundry, driving, shopping, community activity, occupation, and yard work  PERSONAL FACTORS: Time since onset of injury/illness/exacerbation and 1-2 comorbidities: HLD, adhesive capsulitis, upcoming breathing surgery are also affecting patient's functional outcome.   REHAB POTENTIAL: Good  CLINICAL DECISION MAKING: Evolving/moderate complexity  EVALUATION COMPLEXITY: Moderate   GOALS: Goals reviewed with patient? Yes  SHORT TERM GOALS: Target date: 07/11/2024    Patient will be independent with HEP in order to improve functional outcomes. Baseline: Goal status: MET 06/27/24  2.  Patient will report at least 25% improvement in symptoms for improved quality of life. Baseline:  Goal status: MET 08/12/24   LONG TERM GOALS: Target date: 08/08/2024    Patient will report at least 75% improvement in symptoms for improved quality of life. Baseline:  Goal status: IN PROGRESS  2.  Patient will improve UEFS score by at least 18 points in order to indicate improved tolerance to activity. Baseline: 16/80 Goal status: MET10/22/25  3.  Patient will demonstrate at least 155 in L shoulder AROM in flexion for improved ability lift overhead. Baseline:  Goal status: IN PROGRESS  4.  Patient will be able to return to all activities unrestricted for improved ability to perform work functions and participate with family.  Baseline:  Goal status: IN PROGRESS  5.  Patient will demonstrate grade of 4+/5 MMT grade in all tested musculature as evidence of improved strength to assist with lifting at home. Baseline:  Goal status: IN PROGRESS    PLAN:  PT FREQUENCY: 2x/week  PT  DURATION: 8 weeks  PLANNED INTERVENTIONS:[id number]- PT Re-evaluation, 97110-Therapeutic exercises, 97530- Therapeutic activity, [id number]- Neuromuscular re-education, 97535- Self Care, [id number]- Manual therapy, [phone]- Gait training, [phone]- Orthotic Fit/training, [phone]- Canalith repositioning, [id number]- Aquatic Therapy, [phone]- Splinting, [phone]- Wound care (first 20 sq cm), 97598- Wound care (each additional 20 sq cm)Patient/Family education, Balance training, Stair training, Taping, Dry Needling, Joint mobilization, Joint manipulation, Spinal manipulation, Spinal mobilization, Scar mobilization, and DME instructions.  PLAN FOR NEXT SESSION: Manual for pain/mobility, L shoulder ROM and strengthening   Lili Finder, Student-PT 08/12/2024, 2:28 PM   This entire session was performed under direct supervision and direction of a licensed therapist/therapist assistant . I have personally read, edited and approve of the note as written. 2:41 PM, 08/12/24 Prentice CANDIE Stains PT, DPT Physical Therapist at [facility]

## 2024-08-19 ENCOUNTER — Ambulatory Visit (HOSPITAL_BASED_OUTPATIENT_CLINIC_OR_DEPARTMENT_OTHER): Admitting: Physical Therapy

## 2024-08-19 ENCOUNTER — Encounter (HOSPITAL_BASED_OUTPATIENT_CLINIC_OR_DEPARTMENT_OTHER): Payer: Self-pay | Admitting: Physical Therapy

## 2024-08-19 DIAGNOSIS — R29898 Other symptoms and signs involving the musculoskeletal system: Secondary | ICD-10-CM

## 2024-08-19 DIAGNOSIS — M25512 Pain in left shoulder: Secondary | ICD-10-CM

## 2024-08-19 DIAGNOSIS — M25612 Stiffness of left shoulder, not elsewhere classified: Secondary | ICD-10-CM | POA: Diagnosis not present

## 2024-08-19 DIAGNOSIS — M6281 Muscle weakness (generalized): Secondary | ICD-10-CM

## 2024-08-19 NOTE — Therapy (Signed)
 OUTPATIENT PHYSICAL THERAPY SHOULDER  TREATMENT/PROGRESS NOTE   Patient Name: Alyssa Schwartz MRN: [id number] DOB:10-24-1960, 63 y.o., female Today's Date: 08/19/2024  END OF SESSION:  PT End of Session - 08/19/24 0935     Visit Number 6    Number of Visits 24    Date for Recertification  10/07/24    Authorization Time Period 15 visits approved (preliminary) 05/19/24-09/16/24    PT Start Time 1015    PT Stop Time 1057    PT Time Calculation (min) 42 min    Activity Tolerance Patient tolerated treatment well    Behavior During Therapy [facility] for tasks assessed/performed           Past Medical History:  Diagnosis Date   Anxiety    Asthma    Carpal tunnel syndrome of left wrist 12/20/2012   Depression    GERD (gastroesophageal reflux disease)    History of foot surgery    2 surgeries on left foot, 3 on right foot   Mental disorder    anger issues   Nerve damage    hands   Plantar fasciitis    bilateral   PONV (postoperative nausea and vomiting)    Seizures (HCC)    last sz >20 yrs ago   Sleep apnea    does not use CPAP   Past Surgical History:  Procedure Laterality Date   BUNIONECTOMY     CARPAL TUNNEL RELEASE Left 12/26/2012   Procedure: CARPAL TUNNEL RELEASE;  Surgeon: Arley JONELLE Curia, MD;  Location: [facility];  Service: Orthopedics;  Laterality: Left;   CARPAL TUNNEL RELEASE Right 01/12/2013   Procedure: Right Carpal Tunnel Release;  Surgeon: Arley JONELLE Curia, MD;  Location: [facility];  Service: Orthopedics;  Laterality: Right;   DRUG INDUCED ENDOSCOPY N/A 04/10/2024   Procedure: DRUG INDUCED SLEEP ENDOSCOPY;  Surgeon: Tobie Eldora NOVAK, MD;  Location: [facility];  Service: ENT;  Laterality: N/A;   ELBOW SURGERY Left    HAMMER TOE SURGERY     IMPLANTATION OF HYPOGLOSSAL NERVE STIMULATOR Right 06/29/2024   Procedure: RIGHT HYPOGLOSSAL NERVE STIMULATOR PLACEMENT;  Surgeon: Tobie Eldora NOVAK, MD;  Location:  SURGERY CENTER;   Service: ENT;  Laterality: Right;   PLANTAR FASCIA RELEASE     Patient Active Problem List   Diagnosis Date Noted   Obstructive sleep apnea (adult) (pediatric) 06/29/2024   OSA (obstructive sleep apnea) 04/10/2024   Intolerance of continuous positive airway pressure (CPAP) ventilation 04/10/2024   [facility]  REFERRING PROVIDER: Canda Franky Jurist, MD  REFERRING DIAG: M75.02 (ICD-10-CM) - Adhesive capsulitis of left shoulder  THERAPY DIAG:  Stiffness of left shoulder, not elsewhere classified  Left shoulder pain, unspecified chronicity  Muscle weakness (generalized)  Other symptoms and signs involving the musculoskeletal system  Rationale for Evaluation and Treatment: Rehabilitation  ONSET DATE: DOS 05/18/24  SUBJECTIVE:  SUBJECTIVE STATEMENT: Patient reports she is doing well. Shoulder has been bothering her a bit since she has been having to sleep on it due to sleep apnea monitor on right side.  Eval: Patient states L  shoulder surgery. She had an arthroscopic lysis of adhesions and manipulation under anesthesia with open distal clavicle excision on 05/18/24. Having surgery for a device to help her breath on 06/29/24. Minimal ability to use L arm currently.  Hand dominance: Right  PERTINENT HISTORY: HDL, adhesive capsulitis which began about 3-4 months ago  PAIN:  Are you having pain? Yes: NPRS scale: 7/10 Pain location: L shoulder Pain description: sharp Aggravating factors: movement Relieving factors: pain meds, ice  PRECAUTIONS: None  RED FLAGS: None   WEIGHT BEARING RESTRICTIONS: No  FALLS:  Has patient fallen in last 6 months? No  OCCUPATION: Retired   PLOF: Independent  PATIENT GOALS:good movement  OBJECTIVE: (objective measures from initial evaluation unless otherwise  dated)  PATIENT SURVEYS:  UEFS  Extreme difficulty/unable (0), Quite a bit of difficulty (1), Moderate difficulty (2), Little difficulty (3), No difficulty (4) Survey date:  06/13/24 08/12/24  Any of your usual work, household or school activities 0 2  2. Your usual hobbies, recreational/sport activities 0 2   3. Lifting a bag of groceries to waist level 1 3   4. Lifting a bag of groceries above your head 1 1  5. Grooming your hair 1 2  6. Pushing up on your hands (I.e. from bathtub or chair) 2 2  7. Preparing food (I.e. peeling/cutting) 1 3  8. Driving  0 2  9. Vacuuming, sweeping, or raking 0 3  10. Dressing  0 2  11. Doing up buttons 1 3  12. Using tools/appliances 0 3  13. Opening doors 3 3  14. Cleaning  1 3  15. Tying or lacing shoes 1 2  16. Sleeping  1 3  17. Laundering clothes (I.e. washing, ironing, folding) 1 2  18. Opening a jar 1 2  19. Throwing a ball 1 3  20. Carrying a small suitcase with your affected limb.  1 2  Score total:  16/80 48/80     COGNITION: Overall cognitive status: Within functional limits for tasks assessed     SENSATION: WFL  POSTURE: rounded shoulders, forward head, and increased thoracic kyphosis   UPPER EXTREMITY ROM: PROM: flexion: 123 degree, ER 25  Active ROM Right eval Left eval Left 9/3 Left 08/12/24  Shoulder flexion 145 90* 130deg 130 deg   Shoulder extension      Shoulder abduction 145 68*  120 deg   Shoulder adduction      Shoulder internal rotation (functional) T8 L5  T8  Shoulder external rotation (functional) T4 occiput  T1  Elbow flexion      Elbow extension      Wrist flexion      Wrist extension      Wrist ulnar deviation      Wrist radial deviation      Wrist pronation      Wrist supination      (Blank rows = not tested) *=pain/symptoms  UPPER EXTREMITY MMT:  MMT Right eval Left eval Right 08/12/24 Left 08/12/24  Shoulder flexion 4 3+* 4+ 4-*  Shoulder extension      Shoulder abduction 4+ 3+*     Shoulder adduction      Shoulder internal rotation 4+ 4-* 4+ 4-*  Shoulder external rotation 4 4-* 4+ 4+  Middle trapezius  Lower trapezius      Elbow flexion 5 5    Elbow extension 5 5    Wrist flexion      Wrist extension      Wrist ulnar deviation      Wrist radial deviation      Wrist pronation      Wrist supination      Grip strength (lbs)      (Blank rows = not tested) *=pain/symptoms   JOINT MOBILITY TESTING:  Hypomobile GH  PALPATION:  No TTP L shoulder   TODAY'S TREATMENT:                                                                                                                                         DATE:  10/29  There-ex Wand flexion 2x10 2lbs  Wand press x15 2lbs  SL ER 3x10   Manual:  Trigger point release to upper trap  Grade II and III inferior and posterior glides  ER with distraction  Flexion stretch with distraction   Neuro-re-ed:  Row with posture and breathing 3x10 green  Shoulder extension 3x10   08/12/24 NuStep x 6 min lvl 5 Reassessment  Shoulder abduction on ladder x10 with 3 second holds at the top Shoulder flexion on ladder x10 with 3 second holds  Ball wall circles 3x10 both directions  Shoulder flexion with perpendicular resistance RTB 2x10  06/27/24 PROM GHJ mobilizations Supine wand flexion 2x10 3seconds S/l ER 2x10 Theraband row GTB 2x15 Finger ladder flexion and abduction x5ea IR behind back stretching 5 x20  Standing AROM (to assess visually)  06/24/24 PROM GHJ mobilizations Supine wand flexion 2x10 Theraband row GTB x20 Finger ladder flexion and abduction x10ea IR behind back stretching 5 x10   8/27 Manual:  Trigger point release to upper trap  Grade I and II inferior and posterior glides  ER with distraction  Flexion stretch with distraction   There-ex  Wand press 2x12 Wand flexion 2x12 Active ER/IR 3x10   Nero-re-ed:  Row with cuing for posture 3x12 red  Shoulder extension with cuing for  posture 3x12 red   Given for HEP    06/13/24 Supine AAROM flexion with SPC x 5 Supine AAROM ER with SPC x 5  PATIENT EDUCATION:  Education details: Patient educated on exam findings, POC, scope of PT, HEP, relevant anatomy/biomechanics. Person educated: Patient Education method: Explanation, Demonstration, and Handouts Education comprehension: verbalized understanding, returned demonstration, verbal cues required, and tactile cues required  HOME EXERCISE PROGRAM: Access Code: [email or web address] URL: [email or web address] Date: 06/13/2024 Prepared by: Prentice Zaunegger  Exercises - Supine Shoulder Flexion Extension AAROM with Dowel  - 3 x daily - 7 x weekly - 2 sets - 10 reps - Supine Shoulder External Rotation with Dowel  - 3 x daily - 7 x weekly - 2 sets - 10 reps - Shoulder Flexion Wall Slide with Towel (Mirrored)  -  3 x daily - 7 x weekly - 2 sets - 10 reps  ASSESSMENT:  CLINICAL IMPRESSION: The patient had a large trigger point in her upper trap today. We performed manual therapy and reviewed self soft tissue mobilization. We added in side lying ER today. She tolerated well. We reviewed how to do scapular exercises without overusing upper trap. She was slightly limited in ER today.  Eval: Patient a 63 y.o. y.o. female who was seen today for physical therapy evaluation and treatment for Adhesive capsulitis of left shoulder. Patient presents with pain limited deficits in L shoulder strength, ROM, endurance, activity tolerance, and functional mobility with ADL. Patient is having to modify and restrict ADL as indicated by outcome measure score as well as subjective information and objective measures which is affecting overall participation. Patient will benefit from skilled physical therapy in order to improve function and reduce impairment.  OBJECTIVE IMPAIRMENTS: decreased activity tolerance, decreased endurance, decreased mobility, decreased ROM, decreased strength, increased  muscle spasms, impaired flexibility, impaired UE functional use, improper body mechanics, and pain  ACTIVITY LIMITATIONS:  carrying, lifting, bending, sleeping, bed mobility, reach over head, hygiene/grooming, and caring for others  PARTICIPATION LIMITATIONS:  meal prep, cleaning, laundry, driving, shopping, community activity, occupation, and yard work  PERSONAL FACTORS: Time since onset of injury/illness/exacerbation and 1-2 comorbidities: HLD, adhesive capsulitis, upcoming breathing surgery are also affecting patient's functional outcome.   REHAB POTENTIAL: Good  CLINICAL DECISION MAKING: Evolving/moderate complexity  EVALUATION COMPLEXITY: Moderate   GOALS: Goals reviewed with patient? Yes  SHORT TERM GOALS: Target date: 07/11/2024    Patient will be independent with HEP in order to improve functional outcomes. Baseline: Goal status: MET 06/27/24  2.  Patient will report at least 25% improvement in symptoms for improved quality of life. Baseline:  Goal status: MET 08/12/24   LONG TERM GOALS: Target date: 08/08/2024    Patient will report at least 75% improvement in symptoms for improved quality of life. Baseline:  Goal status: IN PROGRESS  2.  Patient will improve UEFS score by at least 18 points in order to indicate improved tolerance to activity. Baseline: 16/80 Goal status: MET10/22/25  3.  Patient will demonstrate at least 155 in L shoulder AROM in flexion for improved ability lift overhead. Baseline:  Goal status: IN PROGRESS  4.  Patient will be able to return to all activities unrestricted for improved ability to perform work functions and participate with family.  Baseline:  Goal status: IN PROGRESS  5.  Patient will demonstrate grade of 4+/5 MMT grade in all tested musculature as evidence of improved strength to assist with lifting at home. Baseline:  Goal status: IN PROGRESS    PLAN:  PT FREQUENCY: 2x/week  PT DURATION: 8 weeks  PLANNED  INTERVENTIONS:[id number]- PT Re-evaluation, 97110-Therapeutic exercises, 97530- Therapeutic activity, [id number]- Neuromuscular re-education, 97535- Self Care, [id number]- Manual therapy, [phone]- Gait training, [phone]- Orthotic Fit/training, [phone]- Canalith repositioning, [id number]- Aquatic Therapy, [phone]- Splinting, [phone]- Wound care (first 20 sq cm), 97598- Wound care (each additional 20 sq cm)Patient/Family education, Balance training, Stair training, Taping, Dry Needling, Joint mobilization, Joint manipulation, Spinal manipulation, Spinal mobilization, Scar mobilization, and DME instructions.  PLAN FOR NEXT SESSION: Manual for pain/mobility, L shoulder ROM and strengthening   Alm JINNY Don, PT 08/19/2024, 12:04 PM

## 2024-09-04 ENCOUNTER — Ambulatory Visit (HOSPITAL_BASED_OUTPATIENT_CLINIC_OR_DEPARTMENT_OTHER): Admitting: Physical Therapy

## 2024-09-15 ENCOUNTER — Encounter (HOSPITAL_BASED_OUTPATIENT_CLINIC_OR_DEPARTMENT_OTHER)

## 2024-09-22 ENCOUNTER — Encounter (HOSPITAL_BASED_OUTPATIENT_CLINIC_OR_DEPARTMENT_OTHER)

## 2024-09-29 ENCOUNTER — Encounter (HOSPITAL_BASED_OUTPATIENT_CLINIC_OR_DEPARTMENT_OTHER): Admitting: Physical Therapy

## 2024-10-06 ENCOUNTER — Encounter (HOSPITAL_BASED_OUTPATIENT_CLINIC_OR_DEPARTMENT_OTHER): Admitting: Physical Therapy
